# Patient Record
Sex: Female | Born: 1960
Health system: Southern US, Community
[De-identification: ages and names within clinical notes are randomized; demographics above are authoritative.]

## PROBLEM LIST (undated history)

## (undated) DIAGNOSIS — C801 Malignant (primary) neoplasm, unspecified: Secondary | ICD-10-CM

## (undated) DIAGNOSIS — Z720 Tobacco use: Secondary | ICD-10-CM

## (undated) DIAGNOSIS — R590 Localized enlarged lymph nodes: Secondary | ICD-10-CM

## (undated) DIAGNOSIS — R918 Other nonspecific abnormal finding of lung field: Principal | ICD-10-CM

## (undated) DIAGNOSIS — R0600 Dyspnea, unspecified: Secondary | ICD-10-CM

## (undated) HISTORY — DX: Other nonspecific abnormal finding of lung field: R91.8

## (undated) HISTORY — PX: NO PAST SURGERIES: SHX2092

## (undated) HISTORY — DX: Localized enlarged lymph nodes: R59.0

## (undated) HISTORY — DX: Tobacco use: Z72.0

---

## 2013-11-15 ENCOUNTER — Ambulatory Visit (INDEPENDENT_AMBULATORY_CARE_PROVIDER_SITE_OTHER): Payer: 59 | Admitting: Family Medicine

## 2013-11-15 ENCOUNTER — Ambulatory Visit (INDEPENDENT_AMBULATORY_CARE_PROVIDER_SITE_OTHER)
Admission: RE | Admit: 2013-11-15 | Discharge: 2013-11-15 | Disposition: A | Discharge status: Home / Self Care | Payer: 59 | Source: Ambulatory Visit | Attending: Family Medicine | Admitting: Family Medicine

## 2013-11-15 ENCOUNTER — Encounter: Payer: Self-pay | Admitting: Family Medicine

## 2013-11-15 VITALS — BP 136/82 | HR 84 | Temp 98.1°F | Ht 62.0 in | Wt 204.2 lb

## 2013-11-15 DIAGNOSIS — R918 Other nonspecific abnormal finding of lung field: Secondary | ICD-10-CM

## 2013-11-15 DIAGNOSIS — F17200 Nicotine dependence, unspecified, uncomplicated: Secondary | ICD-10-CM

## 2013-11-15 DIAGNOSIS — Z720 Tobacco use: Secondary | ICD-10-CM | POA: Insufficient documentation

## 2013-11-15 DIAGNOSIS — N951 Menopausal and female climacteric states: Secondary | ICD-10-CM | POA: Insufficient documentation

## 2013-11-15 NOTE — Assessment & Plan Note (Signed)
Continue to encourage cessation Declines pharmacotherapy. Discussed [email or web address] and quit coaches available. 30+ PY hx.

## 2013-11-15 NOTE — Progress Notes (Signed)
BP 136/82  Pulse 84  Temp(Src) 98.1 F (36.7 C) (Oral)  Ht 5\' 2"  (1.575 m)  Wt 204 lb 4 oz (92.647 kg)  BMI 37.35 kg/m2  LMP 09/03/2013   CC: new pt to establish  Subjective:    Patient ID: Rhonda Meyers, female    DOB: 1961/03/25, 53 y.o.   MRN: [id number]  HPI: Dynastee Brummell is a 53 y.o. female presenting on 11/15/2013 for Establish Care   Smoking - 1/2 ppd. Trying to quit. Motivated to quit. Declines pharmacotherapy. Denies dyspnea, cough. Some dyspnea on exertion with climbing 1-2 flights of stairs.  Trying to increase walking. Has lost several pounds in last few weeks Currently undergoing dental work (extractions)  LMP 09/03/2013 first one in past year - getting less frequent. + hot flashes  Preventative: Last CPE 7 yrs ago Well woman with OBGYN (Dr Kenton Kingfisher) 2 yrs ago. Flu yearly at work Tetanus - thinks has had done about 7 yrs ago Completing hepatitis series.  Lives alone, no pets Occupation: housekeeper at [employer] Activity: regular walking Diet: good water, fruits/vegetables daily  Relevant past medical, surgical, family and social history reviewed and updated as indicated.  Allergies and medications reviewed and updated. No current outpatient prescriptions on file prior to visit.   No current facility-administered medications on file prior to visit.    Review of Systems Per HPI unless specifically indicated above    Objective:    BP 136/82  Pulse 84  Temp(Src) 98.1 F (36.7 C) (Oral)  Ht 5\' 2"  (1.575 m)  Wt 204 lb 4 oz (92.647 kg)  BMI 37.35 kg/m2  LMP 09/03/2013  Physical Exam  Nursing note and vitals reviewed. Constitutional: She is oriented to person, place, and time. She appears well-developed and well-nourished. No distress.  HENT:  Head: Normocephalic and atraumatic.  Right Ear: Hearing normal.  Left Ear: Hearing normal.  Nose: Nose normal.  Mouth/Throat: Uvula is midline, oropharynx is clear and moist and mucous membranes  are normal. No oropharyngeal exudate, posterior oropharyngeal edema or posterior oropharyngeal erythema.  Eyes: Conjunctivae and EOM are normal. Pupils are equal, round, and reactive to light. No scleral icterus.  Neck: Normal range of motion. Neck supple.  Cardiovascular: Normal rate, regular rhythm, normal heart sounds and intact distal pulses.   No murmur heard. Pulses:      Radial pulses are 2+ on the right side, and 2+ on the left side.  Pulmonary/Chest: Effort normal. No respiratory distress. She has decreased breath sounds. She has no wheezes. She has no rhonchi. She has no rales.  Coarse sounding R lower lobes  Musculoskeletal: Normal range of motion. She exhibits no edema.  Lymphadenopathy:    She has no cervical adenopathy.  Neurological: She is alert and oriented to person, place, and time.  CN grossly intact, station and gait intact  Skin: Skin is warm and dry. No rash noted.  Psychiatric: She has a normal mood and affect. Her behavior is normal. Judgment and thought content normal.   No results found for this or any previous visit.    Assessment & Plan:   Problem List Items Addressed This Visit   Smoker - Primary     Continue to encourage cessation Declines pharmacotherapy. Discussed [email or web address] and quit coaches available. 30+ PY hx.    Perimenopause     Reviewed sxs and natural course of menopause.    Other nonspecific abnormal finding of lung field     In 30+PY smoker - check xray today for  baseline. Continue to encourage cessation.        Follow up plan: Return in about 3 months (around 02/15/2014), or as needed, for annual exam, prior fasting for blood work.

## 2013-11-15 NOTE — Patient Instructions (Signed)
Good to meet you today! Call us with questions. Return as needed or in 3-4 months for physical, prior fasting for blood work. Let's check xray today for baseline. Keep working towards quitting smoking.

## 2013-11-15 NOTE — Assessment & Plan Note (Signed)
In 30+PY smoker - check xray today for baseline. Continue to encourage cessation.

## 2013-11-15 NOTE — Assessment & Plan Note (Addendum)
Reviewed sxs and natural course of menopause.

## 2013-11-15 NOTE — Addendum Note (Signed)
Addended by: Ria Bush on: 11/15/2013 10:05 AM   Modules accepted: Orders

## 2013-11-15 NOTE — Progress Notes (Signed)
Pre visit review using our clinic review tool, if applicable. No additional management support is needed unless otherwise documented below in the visit note. 

## 2017-10-22 ENCOUNTER — Ambulatory Visit (INDEPENDENT_AMBULATORY_CARE_PROVIDER_SITE_OTHER): Payer: Self-pay | Admitting: Family Medicine

## 2017-10-22 ENCOUNTER — Encounter: Payer: Self-pay | Admitting: Family Medicine

## 2017-10-22 VITALS — BP 124/80 | HR 86 | Temp 98.4°F | Ht 62.0 in | Wt 190.0 lb

## 2017-10-22 DIAGNOSIS — Z0000 Encounter for general adult medical examination without abnormal findings: Secondary | ICD-10-CM

## 2017-10-22 LAB — POCT URINALYSIS DIPSTICK
Bilirubin, UA: NEGATIVE
Blood, UA: NEGATIVE
Glucose, UA: NEGATIVE
KETONES UA: NEGATIVE
Nitrite, UA: NEGATIVE
PH UA: 5.5 (ref 5.0–8.0)
PROTEIN UA: NEGATIVE
UROBILINOGEN UA: 0.2 U/dL

## 2017-10-22 NOTE — Patient Instructions (Addendum)
Establish and follow-up with a PCP to address overdue health maintenance and obtain routine screening labs.   Goal BMI  <25. Encouraged efforts to reduce weight include engaging in physical activity as tolerated with goal of 150 minutes per week. Improve dietary choices and eat a meal regimen consistent with a Mediterranean or DASH diet. Reduce simple carbohydrates. Do not skip meals and eat healthy snacks throughout the day to avoid over-eating at dinner. Set a goal weight loss that is achievable for you.    Preventive Care 40-64 Years, Female Preventive care refers to lifestyle choices and visits with your health care provider that can promote health and wellness. What does preventive care include?  A yearly physical exam. This is also called an annual well check.  Dental exams once or twice a year.  Routine eye exams. Ask your health care provider how often you should have your eyes checked.  Personal lifestyle choices, including: ? Daily care of your teeth and gums. ? Regular physical activity. ? Eating a healthy diet. ? Avoiding tobacco and drug use. ? Limiting alcohol use. ? Practicing safe sex. ? Taking low-dose aspirin daily starting at age 4. ? Taking vitamin and mineral supplements as recommended by your health care provider. What happens during an annual well check? The services and screenings done by your health care provider during your annual well check will depend on your age, overall health, lifestyle risk factors, and family history of disease. Counseling Your health care provider may ask you questions about your:  Alcohol use.  Tobacco use.  Drug use.  Emotional well-being.  Home and relationship well-being.  Sexual activity.  Eating habits.  Work and work Statistician.  Method of birth control.  Menstrual cycle.  Pregnancy history.  Screening You may have the following tests or measurements:  Height, weight, and BMI.  Blood  pressure.  Lipid and cholesterol levels. These may be checked every 5 years, or more frequently if you are over 33 years old.  Skin check.  Lung cancer screening. You may have this screening every year starting at age 19 if you have a 30-pack-year history of smoking and currently smoke or have quit within the past 15 years.  Fecal occult blood test (FOBT) of the stool. You may have this test every year starting at age 86.  Flexible sigmoidoscopy or colonoscopy. You may have a sigmoidoscopy every 5 years or a colonoscopy every 10 years starting at age 79.  Hepatitis C blood test.  Hepatitis B blood test.  Sexually transmitted disease (STD) testing.  Diabetes screening. This is done by checking your blood sugar (glucose) after you have not eaten for a while (fasting). You may have this done every 1-3 years.  Mammogram. This may be done every 1-2 years. Talk to your health care provider about when you should start having regular mammograms. This may depend on whether you have a family history of breast cancer.  BRCA-related cancer screening. This may be done if you have a family history of breast, ovarian, tubal, or peritoneal cancers.  Pelvic exam and Pap test. This may be done every 3 years starting at age 12. Starting at age 35, this may be done every 5 years if you have a Pap test in combination with an HPV test.  Bone density scan. This is done to screen for osteoporosis. You may have this scan if you are at high risk for osteoporosis.  Discuss your test results, treatment options, and if necessary, the need for  more tests with your health care provider. Vaccines Your health care provider may recommend certain vaccines, such as:  Influenza vaccine. This is recommended every year.  Tetanus, diphtheria, and acellular pertussis (Tdap, Td) vaccine. You may need a Td booster every 10 years.  Varicella vaccine. You may need this if you have not been vaccinated.  Zoster vaccine. You  may need this after age 28.  Measles, mumps, and rubella (MMR) vaccine. You may need at least one dose of MMR if you were born in 1957 or later. You may also need a second dose.  Pneumococcal 13-valent conjugate (PCV13) vaccine. You may need this if you have certain conditions and were not previously vaccinated.  Pneumococcal polysaccharide (PPSV23) vaccine. You may need one or two doses if you smoke cigarettes or if you have certain conditions.  Meningococcal vaccine. You may need this if you have certain conditions.  Hepatitis A vaccine. You may need this if you have certain conditions or if you travel or work in places where you may be exposed to hepatitis A.  Hepatitis B vaccine. You may need this if you have certain conditions or if you travel or work in places where you may be exposed to hepatitis B.  Haemophilus influenzae type b (Hib) vaccine. You may need this if you have certain conditions.  Talk to your health care provider about which screenings and vaccines you need and how often you need them. This information is not intended to replace advice given to you by your health care provider. Make sure you discuss any questions you have with your health care provider. Document Released: 05/19/2015 Document Revised: 01/10/2016 Document Reviewed: 02/21/2015 Elsevier Interactive Patient Education  2018 [place] Risks of Smoking Smoking cigarettes is very bad for your health. Tobacco smoke has over 200 known poisons in it. It contains the poisonous gases nitrogen oxide and carbon monoxide. There are over 60 chemicals in tobacco smoke that cause cancer. Smoking is difficult to quit because a chemical in tobacco, called nicotine, causes addiction or dependence. When you smoke and inhale, nicotine is absorbed rapidly into the bloodstream through your lungs. Both inhaled and non-inhaled nicotine may be addictive. What are the risks of cigarette smoke? Cigarette smokers  have an increased risk of many serious medical problems, including:  Lung cancer.  Lung disease, such as pneumonia, bronchitis, and emphysema.  Chest pain (angina) and heart attack because the heart is not getting enough oxygen.  Heart disease and peripheral blood vessel disease.  High blood pressure (hypertension).  Stroke.  Oral cancer, including cancer of the lip, mouth, or voice box.  Bladder cancer.  Pancreatic cancer.  Cervical cancer.  Pregnancy complications, including premature birth.  Stillbirths and smaller newborn babies, birth defects, and genetic damage to sperm.  Early menopause.  Lower estrogen level for women.  Infertility.  Facial wrinkles.  Blindness.  Increased risk of broken bones (fractures).  Senile dementia.  Stomach ulcers and internal bleeding.  Delayed wound healing and increased risk of complications during surgery.  Even smoking lightly shortens your life expectancy by several years.  Because of secondhand smoke exposure, children of smokers have an increased risk of the following:  Sudden infant death syndrome (SIDS).  Respiratory infections.  Lung cancer.  Heart disease.  Ear infections.  What are the benefits of quitting? There are many health benefits of quitting smoking. Here are some of them:  Within days of quitting smoking, your risk of having a heart  attack decreases, your blood flow improves, and your lung capacity improves. Blood pressure, pulse rate, and breathing patterns start returning to normal soon after quitting.  Within months, your lungs may clear up completely.  Quitting for 10 years reduces your risk of developing lung cancer and heart disease to almost that of a nonsmoker.  People who quit may see an improvement in their overall quality of life.  How do I quit smoking? Smoking is an addiction with both physical and psychological effects, and longtime habits can be hard to change. Your health care  provider can recommend:  Programs and community resources, which may include group support, education, or talk therapy.  Prescription medicines to help reduce cravings.  Nicotine replacement products, such as patches, gum, and nasal sprays. Use these products only as directed. Do not replace cigarette smoking with electronic cigarettes, which are commonly called e-cigarettes. The safety of e-cigarettes is not known, and some may contain harmful chemicals.  A combination of two or more of these methods.  Where to find more information:  American Lung Association: www.lung.org  American Cancer Society: www.cancer.org Summary  Smoking cigarettes is very bad for your health. Cigarette smokers have an increased risk of many serious medical problems, including several cancers, heart disease, and stroke.  Smoking is an addiction with both physical and psychological effects, and longtime habits can be hard to change.  By stopping right away, you can greatly reduce the risk of medical problems for you and your family.  To help you quit smoking, your health care provider can recommend programs, community resources, prescription medicines, and nicotine replacement products such as patches, gum, and nasal sprays. This information is not intended to replace advice given to you by your health care provider. Make sure you discuss any questions you have with your health care provider. Document Released: 05/30/2004 Document Revised: 04/26/2016 Document Reviewed: 04/26/2016 Elsevier Interactive Patient Education  2017 [employer].

## 2017-10-22 NOTE — Progress Notes (Signed)
Subjective:  Rhonda Meyers is a 57 y.o. female who presents for routine wellness exam to satisfy employer sponsored health insurance requirements. Patient reports that she has a PCP at [facility] for which she hasn't seen in several years.  She is an occasional smoker with a greater than 30 pack year history. She reports no routine physical exercise, however, occasionally walks. Body mass index is 34.75 kg/m. She denies chest pain, headache, dizziness,  shortness of breath at rest, reports some exertional shortness of breath with vigorous activity. Denies any known family history significant for heart disease, diabetes, or cancer. She is postmenopausal with her last period occurring greater than 2 years ago.Patient denies any current health related concerns.  There is no immunization history on file for this patient.  No past medical history on file.  Past Surgical History:  Procedure Laterality Date  . NO PAST SURGERIES      Social History   Tobacco Use  . Smoking status: Current Every Day Smoker    Packs/day: 0.50    Types: Cigarettes    Start date: 05/06/1981  . Smokeless tobacco: Never Used  Substance Use Topics  . Alcohol use: Yes    Comment: 1 bottle wine on weekends  . Drug use: No    No Known Allergies  No current outpatient medications on file.   No current facility-administered medications for this visit.     ROS   Objective:  Blood pressure 124/80, pulse 86, temperature 98.4 F (36.9 C), height 5\' 2"  (1.575 m), weight 190 lb (86.2 kg), SpO2 98 %.  General Appearance:  Alert, cooperative, no distress, appears stated age  Head:  Normocephalic, without obvious abnormality, atraumatic  Eyes:  PERRL, conjunctiva/corneas clear, EOM's intact  Ears:  Normal TM's and external ear canals, both ears  Nose: Nares normal, septum midline,mucosa normal, no drainage or sinus tenderness  Throat: Lips, mucosa, and tongue normal; teeth and gums normal  Neck:  Supple, symmetrical, trachea midline, no adenopathy;  thyroid: not enlarged, symmetric, no tenderness/mass/nodules; no carotid bruit or JVD  Back:   Symmetric, no curvature, ROM normal, no CVA tenderness  Lungs:   Clear to auscultation bilaterally, respirations unlabored  Breasts:  No masses or tenderness  Heart:  Regular rate and rhythm, S1 and S2 normal, no murmur, rub, or gallop  Abdomen:   Soft, non-tender, bowel sounds active all four quadrants,  no masses, no organomegaly. Enlarged abdominal girth although soft and non-distended.  Extremities: Extremities normal, atraumatic, no cyanosis or edema  Pulses: 2+ and symmetric  Skin: Skin color, texture, turgor normal, no rashes or lesions  Lymph nodes: Cervical nodes normal  Neurologic: Normal   Assessment:  Wellness Exam   Plan:  Patient education provided.  Recommended immediate follow-up with PCP in order to address overdue health maintenance and obtain screening labs. She is at increased risk for a cardiovascular event given her smoking history and obesity. She is negative of known family history of heart disease. UA significant today for +1 leukocytes.  No urine symptoms. Patient not well hydrated. No treatment indicated. Patient will follow up with PCP.  Carroll Sage. Kenton Kingfisher, MSN, FNP-C [facility]  [address], [place] [address] [phone]

## 2018-01-07 ENCOUNTER — Ambulatory Visit (HOSPITAL_COMMUNITY)
Admission: EM | Admit: 2018-01-07 | Discharge: 2018-01-07 | Disposition: A | Discharge status: Home / Self Care | Payer: 59 | Attending: Family Medicine | Admitting: Family Medicine

## 2018-01-07 ENCOUNTER — Ambulatory Visit (INDEPENDENT_AMBULATORY_CARE_PROVIDER_SITE_OTHER): Payer: 59

## 2018-01-07 ENCOUNTER — Encounter (HOSPITAL_COMMUNITY): Payer: Self-pay

## 2018-01-07 DIAGNOSIS — R0981 Nasal congestion: Secondary | ICD-10-CM | POA: Diagnosis not present

## 2018-01-07 DIAGNOSIS — J40 Bronchitis, not specified as acute or chronic: Secondary | ICD-10-CM | POA: Diagnosis not present

## 2018-01-07 DIAGNOSIS — R5383 Other fatigue: Secondary | ICD-10-CM | POA: Diagnosis not present

## 2018-01-07 DIAGNOSIS — R05 Cough: Secondary | ICD-10-CM | POA: Diagnosis not present

## 2018-01-07 MED ORDER — IPRATROPIUM-ALBUTEROL 0.5-2.5 (3) MG/3ML IN SOLN
3.0000 mL | Freq: Once | RESPIRATORY_TRACT | Status: AC
  Administered 2018-01-07: 3 mL via RESPIRATORY_TRACT

## 2018-01-07 MED ORDER — IPRATROPIUM-ALBUTEROL 0.5-2.5 (3) MG/3ML IN SOLN
RESPIRATORY_TRACT | Status: AC
  Filled 2018-01-07: qty 3

## 2018-01-07 MED ORDER — PREDNISONE 10 MG (21) PO TBPK: ORAL_TABLET | ORAL | 0 refills | Status: DC

## 2018-01-07 MED ORDER — ALBUTEROL SULFATE HFA 108 (90 BASE) MCG/ACT IN AERS: 1.0000 | INHALATION_SPRAY | Freq: Four times a day (QID) | RESPIRATORY_TRACT | 0 refills | Status: DC | PRN

## 2018-01-07 NOTE — ED Notes (Signed)
Patient transported to X-ray 

## 2018-01-07 NOTE — ED Provider Notes (Signed)
[facility]    CSN: [id number] Arrival date & time: 01/07/18  1431     History   Chief Complaint Chief Complaint  Patient presents with  . Cough  . Fatigue  . Congestion    HPI Rhonda Meyers is a 57 y.o. female.   Patient is a 57 year old female with no significant past medical history.  She presents with 2 weeks of cough, congestion, fatigue.  The symptoms have been constant.  She has felt slightly better since being able to get some congestion up.  She has been drinking a lot of water and taken some over-the-counter cough and congestion medication.  She does smoke but denies any history of asthma, COPD.  Denies any associated fevers, chills, body aches, fatigue, activity change, loss of appetite.  Denies any chest pain, shortness of breath.   Family history of cancer, diabetes, stroke, retention. She is a current everyday smoker.  Does not drink.  No drug use ROS per HPI         History reviewed. No pertinent past medical history.  Patient Active Problem List   Diagnosis Date Noted  . Smoker 11/15/2013  . Other nonspecific abnormal finding of lung field 11/15/2013  . Perimenopause 11/15/2013    Past Surgical History:  Procedure Laterality Date  . NO PAST SURGERIES      OB History   None      Home Medications    Prior to Admission medications   Medication Sig Start Date End Date Taking? Authorizing Provider  albuterol (PROVENTIL HFA;VENTOLIN HFA) 108 (90 Base) MCG/ACT inhaler Inhale 1-2 puffs into the lungs every 6 (six) hours as needed for wheezing or shortness of breath. 01/07/18   Loura Halt A, NP  predniSONE (STERAPRED UNI-PAK 21 TAB) 10 MG (21) TBPK tablet 6 tabs for 1 day, then 5 tabs for 1 das, then 4 tabs for 1 day, then 3 tabs for 1 day, 2 tabs for 1 day, then 1 tab for 1 day 01/07/18   Orvan July, NP    Family History Family History  Problem Relation Age of Onset  . Cancer Maternal Grandmother 58       breast  . Rheumatic  fever Mother   . CAD Mother 52       MI  . Hypertension Sister   . Diabetes Neg Hx   . Stroke Neg Hx     Social History Social History   Tobacco Use  . Smoking status: Current Every Day Smoker    Packs/day: 0.50    Types: Cigarettes    Start date: 05/06/1981  . Smokeless tobacco: Never Used  Substance Use Topics  . Alcohol use: Yes    Comment: 1 bottle wine on weekends  . Drug use: No     Allergies   Patient has no known allergies.   Review of Systems Review of Systems   Physical Exam Triage Vital Signs ED Triage Vitals  Enc Vitals Group     BP 01/07/18 1514 122/85     Pulse Rate 01/07/18 1514 77     Resp 01/07/18 1514 16     Temp 01/07/18 1514 97.9 F (36.6 C)     Temp Source 01/07/18 1514 Oral     SpO2 01/07/18 1514 95 %     Weight --      Height --      Head Circumference --      Peak Flow --      Pain Score 01/07/18  1521 0     Pain Loc --      Pain Edu? --      Excl. in [place]? --    No data found.  Updated Vital Signs BP 122/85 (BP Location: Left Arm)   Pulse 77   Temp 97.9 F (36.6 C) (Oral)   Resp 16   LMP 09/03/2013   SpO2 95%   Visual Acuity Right Eye Distance:   Left Eye Distance:   Bilateral Distance:    Right Eye Near:   Left Eye Near:    Bilateral Near:     Physical Exam  Constitutional: She is oriented to person, place, and time. She appears well-developed and well-nourished. No distress.  Very pleasant. Non toxic or ill appearing.     HENT:  Head: Normocephalic and atraumatic.  Right Ear: External ear normal.  Left Ear: External ear normal.  Mouth/Throat: Oropharynx is clear and moist.  Eyes: Conjunctivae are normal.  Neck: Normal range of motion.  Cardiovascular: Normal rate and regular rhythm.  Pulmonary/Chest: Effort normal.  Course lung sounds in all fields.  No dyspnea or respiratory distress.  Musculoskeletal: Normal range of motion.  Neurological: She is alert and oriented to person, place, and time.  Skin: Skin  is warm and dry.  Psychiatric: She has a normal mood and affect.  Nursing note and vitals reviewed.    UC Treatments / Results  Labs (all labs ordered are listed, but only abnormal results are displayed) Labs Reviewed - No data to display  EKG None  Radiology Dg Chest 2 View  Result Date: 01/07/2018 CLINICAL DATA:  Cough for 2 weeks. EXAM: CHEST - 2 VIEW COMPARISON:  11/15/2013 FINDINGS: There is no focal parenchymal opacity. There is no pleural effusion or pneumothorax. There is stable cardiomegaly. The osseous structures are unremarkable. IMPRESSION: No active cardiopulmonary disease. Electronically Signed   By: Kathreen Devoid   On: 01/07/2018 16:11    Procedures Procedures (including critical care time)  Medications Ordered in UC Medications  ipratropium-albuterol (DUONEB) 0.5-2.5 (3) MG/3ML nebulizer solution 3 mL (3 mLs Nebulization Given 01/07/18 1609)    Initial Impression / Assessment and Plan / UC Course  I have reviewed the triage vital signs and the nursing notes.  Pertinent labs & imaging results that were available during my care of the patient were reviewed by me and considered in my medical decision making (see chart for details).     X-ray negative for infection. Most likely bronchitis Will treat with albuterol inhaler as needed and burst of steroids. Continue staying hydrated and using over-the-counter cough and congestion medication as needed for symptoms. Follow up as needed for continued or worsening symptoms  Final Clinical Impressions(s) / UC Diagnoses   Final diagnoses:  Bronchitis     Discharge Instructions     It was nice meeting you!!  Your x-ray was negative for infection. Most likely symptoms are related to a viral upper respiratory infection with some bronchitis. I will give you an albuterol inhaler to use at home as needed for cough, wheezing, shortness of breath.  I am also going to give you a round of prednisone to help with  inflammation in your lungs. Follow-up as needed for continued or worsening symptoms      ED Prescriptions    Medication Sig Dispense Auth. Provider   albuterol (PROVENTIL HFA;VENTOLIN HFA) 108 (90 Base) MCG/ACT inhaler Inhale 1-2 puffs into the lungs every 6 (six) hours as needed for wheezing or shortness of breath.  1 Inhaler Jaron Czarnecki A, NP   predniSONE (STERAPRED UNI-PAK 21 TAB) 10 MG (21) TBPK tablet 6 tabs for 1 day, then 5 tabs for 1 das, then 4 tabs for 1 day, then 3 tabs for 1 day, 2 tabs for 1 day, then 1 tab for 1 day 21 tablet Etha Stambaugh A, NP     Controlled Substance Prescriptions [place] Controlled Substance Registry consulted? Not Applicable   Orvan July, NP 01/07/18 1714

## 2018-01-07 NOTE — Discharge Instructions (Addendum)
It was nice meeting you!!  Your x-ray was negative for infection. Most likely symptoms are related to a viral upper respiratory infection with some bronchitis. I will give you an albuterol inhaler to use at home as needed for cough, wheezing, shortness of breath.  I am also going to give you a round of prednisone to help with inflammation in your lungs. Follow-up as needed for continued or worsening symptoms

## 2018-01-07 NOTE — ED Triage Notes (Signed)
Pt presents with ongoing bronchial cough, fatigue, and chest congestion.

## 2018-01-17 ENCOUNTER — Emergency Department (HOSPITAL_COMMUNITY)
Admission: EM | Admit: 2018-01-17 | Discharge: 2018-01-17 | Disposition: A | Discharge status: Home / Self Care | Payer: 59 | Attending: Emergency Medicine | Admitting: Emergency Medicine

## 2018-01-17 ENCOUNTER — Encounter (HOSPITAL_COMMUNITY): Payer: Self-pay

## 2018-01-17 ENCOUNTER — Emergency Department (HOSPITAL_COMMUNITY): Payer: 59

## 2018-01-17 ENCOUNTER — Ambulatory Visit (INDEPENDENT_AMBULATORY_CARE_PROVIDER_SITE_OTHER)
Admission: EM | Admit: 2018-01-17 | Discharge: 2018-01-17 | Disposition: A | Discharge status: Home / Self Care | Payer: 59 | Source: Home / Self Care

## 2018-01-17 ENCOUNTER — Encounter (HOSPITAL_COMMUNITY): Payer: Self-pay | Admitting: *Deleted

## 2018-01-17 ENCOUNTER — Ambulatory Visit (INDEPENDENT_AMBULATORY_CARE_PROVIDER_SITE_OTHER): Payer: 59

## 2018-01-17 ENCOUNTER — Other Ambulatory Visit: Payer: Self-pay

## 2018-01-17 DIAGNOSIS — R05 Cough: Secondary | ICD-10-CM

## 2018-01-17 DIAGNOSIS — R0602 Shortness of breath: Secondary | ICD-10-CM | POA: Diagnosis not present

## 2018-01-17 DIAGNOSIS — R000 Tachycardia, unspecified: Secondary | ICD-10-CM | POA: Diagnosis not present

## 2018-01-17 DIAGNOSIS — R0789 Other chest pain: Secondary | ICD-10-CM | POA: Diagnosis not present

## 2018-01-17 DIAGNOSIS — R059 Cough, unspecified: Secondary | ICD-10-CM

## 2018-01-17 DIAGNOSIS — R0689 Other abnormalities of breathing: Secondary | ICD-10-CM

## 2018-01-17 DIAGNOSIS — R079 Chest pain, unspecified: Secondary | ICD-10-CM | POA: Diagnosis not present

## 2018-01-17 DIAGNOSIS — F17210 Nicotine dependence, cigarettes, uncomplicated: Secondary | ICD-10-CM | POA: Diagnosis not present

## 2018-01-17 LAB — I-STAT TROPONIN, ED: TROPONIN I, POC: 0 ng/mL (ref 0.00–0.08)

## 2018-01-17 LAB — POCT I-STAT, CHEM 8
BUN: 11 mg/dL (ref 6–20)
CALCIUM ION: 1.14 mmol/L — AB (ref 1.15–1.40)
Chloride: 101 mmol/L (ref 98–111)
Creatinine, Ser: 0.9 mg/dL (ref 0.44–1.00)
Glucose, Bld: 98 mg/dL (ref 70–99)
HCT: 49 % — ABNORMAL HIGH (ref 36.0–46.0)
HEMOGLOBIN: 16.7 g/dL — AB (ref 12.0–15.0)
Potassium: 3.9 mmol/L (ref 3.5–5.1)
SODIUM: 136 mmol/L (ref 135–145)
TCO2: 26 mmol/L (ref 22–32)

## 2018-01-17 LAB — CBC
HCT: 49.4 % — ABNORMAL HIGH (ref 36.0–46.0)
Hemoglobin: 15.1 g/dL — ABNORMAL HIGH (ref 12.0–15.0)
MCH: 27.2 pg (ref 26.0–34.0)
MCHC: 30.6 g/dL (ref 30.0–36.0)
MCV: 88.8 fL (ref 78.0–100.0)
PLATELETS: 315 10*3/uL (ref 150–400)
RBC: 5.56 MIL/uL — AB (ref 3.87–5.11)
RDW: 14.1 % (ref 11.5–15.5)
WBC: 16.6 10*3/uL — ABNORMAL HIGH (ref 4.0–10.5)

## 2018-01-17 LAB — BASIC METABOLIC PANEL
Anion gap: 12 (ref 5–15)
BUN: 10 mg/dL (ref 6–20)
CALCIUM: 9.1 mg/dL (ref 8.9–10.3)
CO2: 25 mmol/L (ref 22–32)
CREATININE: 0.88 mg/dL (ref 0.44–1.00)
Chloride: 100 mmol/L (ref 98–111)
GFR calc non Af Amer: >60 mL/min (ref >60)
Glucose, Bld: 94 mg/dL (ref 70–99)
Potassium: 4 mmol/L (ref 3.5–5.1)
Sodium: 137 mmol/L (ref 135–145)

## 2018-01-17 MED ORDER — IOPAMIDOL (ISOVUE-370) INJECTION 76%
INTRAVENOUS | Status: AC
  Administered 2018-01-17: 60 mL
  Filled 2018-01-17: qty 100

## 2018-01-17 MED ORDER — PREDNISONE 20 MG PO TABS: ORAL_TABLET | ORAL | 0 refills | Status: DC

## 2018-01-17 MED ORDER — PREDNISONE 20 MG PO TABS
60.0000 mg | ORAL_TABLET | Freq: Once | ORAL | Status: AC
  Administered 2018-01-17: 60 mg via ORAL
  Filled 2018-01-17: qty 3

## 2018-01-17 NOTE — ED Triage Notes (Addendum)
Pt states she still had "bronchitis" symptoms- non-productive cough, chest pain with cough, and feeling like she can't breathe. Pt states she was seen here about two weeks ago and was prescribed Prednisone.

## 2018-01-17 NOTE — ED Notes (Signed)
Patient transported to CT 

## 2018-01-17 NOTE — ED Triage Notes (Signed)
Pt here for SOB since last week, seen at urgent care last week and pt diagnosed with bronchitis. Pt still SOB and went back to UC today and had new xray, diagnosed with PNA. Left side diminished. AO x 4

## 2018-01-17 NOTE — ED Provider Notes (Signed)
[facility] [facility] EMERGENCY DEPARTMENT Provider Note   CSN: [id number] Arrival date & time: 01/17/18  1513     History   Chief Complaint Chief Complaint  Patient presents with  . Shortness of Breath    HPI Rhonda Meyers is a 57 y.o. female.  57 yo F with a cc of sob and cough, and going on for the past month. Hx of smoking for past 20 years.  Sob worsening over past 4-5 days.  Had some pinkish sputum that has resolved.  Has had a 35lb weight loss in 4 mos. Tried steroids and breathing treatments without improvement.  Seen at urgent care and sent here due to concerning xray results  Urgent care note was reviewed and they were concerned for multifocal pneumonia that may need IV antibiotics and admission.  Sent here also for CT scan for further delineation of the patient's chest x-ray findings.  The history is provided by the patient.  Illness  This is a new problem. The current episode started yesterday. The problem occurs constantly. Associated symptoms include shortness of breath. Pertinent negatives include no chest pain, no abdominal pain and no headaches. Nothing aggravates the symptoms. Nothing relieves the symptoms. She has tried nothing for the symptoms. The treatment provided no relief.    History reviewed. No pertinent past medical history.  Patient Active Problem List   Diagnosis Date Noted  . Smoker 11/15/2013  . Other nonspecific abnormal finding of lung field 11/15/2013  . Perimenopause 11/15/2013    Past Surgical History:  Procedure Laterality Date  . NO PAST SURGERIES       OB History   None      Home Medications    Prior to Admission medications   Medication Sig Start Date End Date Taking? Authorizing Provider  predniSONE (DELTASONE) 20 MG tablet 2 tabs po daily x 4 days 01/17/18   Deno Etienne, DO    Family History Family History  Problem Relation Age of Onset  . Cancer Maternal Grandmother 54       breast  . Rheumatic fever  Mother   . CAD Mother 54       MI  . Hypertension Sister   . Diabetes Neg Hx   . Stroke Neg Hx     Social History Social History   Tobacco Use  . Smoking status: Current Every Day Smoker    Packs/day: 0.50    Types: Cigarettes    Start date: 05/06/1981  . Smokeless tobacco: Never Used  Substance Use Topics  . Alcohol use: Yes    Comment: 1 bottle wine on weekends  . Drug use: No     Allergies   Patient has no known allergies.   Review of Systems Review of Systems  Constitutional: Negative for chills and fever.  HENT: Negative for congestion and rhinorrhea.   Eyes: Negative for redness and visual disturbance.  Respiratory: Positive for cough and shortness of breath. Negative for wheezing.   Cardiovascular: Negative for chest pain and palpitations.  Gastrointestinal: Negative for abdominal pain, nausea and vomiting.  Genitourinary: Negative for dysuria and urgency.  Musculoskeletal: Negative for arthralgias and myalgias.  Skin: Negative for pallor and wound.  Neurological: Negative for dizziness and headaches.     Physical Exam Updated Vital Signs BP 128/83 (BP Location: Right Arm)   Pulse 99   Temp 98.7 F (37.1 C) (Oral)   Resp (!) 22   Wt 79.8 kg   LMP 09/03/2013   SpO2 98%  BMI 32.19 kg/m   Physical Exam  Constitutional: She is oriented to person, place, and time. She appears well-developed and well-nourished. No distress.  HENT:  Head: Normocephalic and atraumatic.  Eyes: Pupils are equal, round, and reactive to light. EOM are normal.  Neck: Normal range of motion. Neck supple.  Cardiovascular: Normal rate and regular rhythm. Exam reveals no gallop and no friction rub.  No murmur heard. Pulmonary/Chest: Effort normal. She has decreased breath sounds in the left middle field and the left lower field. She has no wheezes. She has no rhonchi. She has no rales.  Abdominal: Soft. She exhibits no distension. There is no tenderness.  Musculoskeletal: She  exhibits no edema or tenderness.  Neurological: She is alert and oriented to person, place, and time.  Skin: Skin is warm and dry. She is not diaphoretic.  Psychiatric: She has a normal mood and affect. Her behavior is normal.  Nursing note and vitals reviewed.    ED Treatments / Results  Labs (all labs ordered are listed, but only abnormal results are displayed) Labs Reviewed  CBC - Abnormal; Notable for the following components:      Result Value   WBC 16.6 (*)    RBC 5.56 (*)    Hemoglobin 15.1 (*)    HCT 49.4 (*)    All other components within normal limits  BASIC METABOLIC PANEL  I-STAT TROPONIN, ED    EKG EKG Interpretation  Date/Time:  Saturday January 17 2018 15:44:31 EDT Ventricular Rate:  98 PR Interval:  128 QRS Duration: 86 QT Interval:  346 QTC Calculation: 441 R Axis:   22 Text Interpretation:  Normal sinus rhythm Cannot rule out Anterior infarct , age undetermined Abnormal ECG No old tracing to compare Confirmed by Deno Etienne [phone]) on 01/17/2018 4:40:38 PM   Radiology Dg Chest 2 View  Result Date: 01/17/2018 CLINICAL DATA:  Cough and chest pain and shortness of breath for 1 month. EXAM: CHEST - 2 VIEW COMPARISON:  Chest x-ray 01/07/2018 FINDINGS: Significant worsening of left lung airspace process when compared the prior study. There is a central appearing "mass ", significant loss of volume in the left hemithorax and diffuse airspace process. I would be very concerned about a central obstructing lesion with obstructive pneumonitis. Recommend chest CT with contrast for further evaluation. The right lung appears clear. IMPRESSION: Significant worsening of left lung airspace process with findings worrisome for a central mass and obstructive pneumonitis. Recommend CT with contrast for further evaluation. Electronically Signed   By: Marijo Sanes M.D.   On: 01/17/2018 15:09   Ct Angio Chest Pe W And/or Wo Contrast  Result Date: 01/17/2018 CLINICAL DATA:   Left-sided chest pain and coughing since yesterday. Shortness of breath 1 month. EXAM: CT ANGIOGRAPHY CHEST WITH CONTRAST TECHNIQUE: Multidetector CT imaging of the chest was performed using the standard protocol during bolus administration of intravenous contrast. Multiplanar CT image reconstructions and MIPs were obtained to evaluate the vascular anatomy. CONTRAST:  17mL ISOVUE-370 IOPAMIDOL (ISOVUE-370) INJECTION 76% COMPARISON:  Chest x-ray today. FINDINGS: Cardiovascular: Mild cardiomegaly. Thoracic aorta is otherwise unremarkable. Pulmonary arterial system is within normal without evidence of emboli. Mild compression of the proximal left lower lobar pulmonary artery due to the perihilar mass and mediastinal adenopathy. Mediastinum/Nodes: 1.3 cm prevascular lymph node. Bulky adenopathy over the region of the AP window extending into the left hilum and subcarinal region. 1.2 cm right paratracheal lymph node. Lungs/Pleura: There is volume loss of the left lung with obstruction of  the left mainstem bronchus. No air-filled central left bronchi are demonstrated. There is a large ill-defined left perihilar/central lung mass continuous with the bulky adenopathy over the left hilum, subcarinal region and AP window. Elevated left hemidiaphragm. Moderate collapse of the left lower lobe with patchy airspace opacification over the lingula. 1.3 cm nodule over the left upper lobe likely metastatic. Small amount left pleural fluid. Right lung is clear. Upper Abdomen: Liver is within normal. Mild prominence of the right adrenal gland. Mild calcification along the medial border of the right lobe of the liver. Musculoskeletal: Unremarkable. Review of the MIP images confirms the above findings. IMPRESSION: Large ill-defined central left perihilar mass continuous with bulky left hilar and mediastinal adenopathy as described likely representing a central primary bronchogenic neoplasm. There is complete obstruction of the left  mainstem bronchus with volume loss of the left lung. Small amount of left pleural fluid. Discrete 1.3 cm nodule over the left upper lobe likely metastatic. Recommend tissue sampling for further evaluation. No evidence of pulmonary emboli. Mild cardiomegaly. Electronically Signed   By: Marin Olp M.D.   On: 01/17/2018 19:00    Procedures Procedures (including critical care time)  Medications Ordered in ED Medications  predniSONE (DELTASONE) tablet 60 mg (has no administration in time range)  iopamidol (ISOVUE-370) 76 % injection (60 mLs  Contrast Given 01/17/18 1800)     Initial Impression / Assessment and Plan / ED Course  I have reviewed the triage vital signs and the nursing notes.  Pertinent labs & imaging results that were available during my care of the patient were reviewed by me and considered in my medical decision making (see chart for details).     57 yo F with cc of sob.  Going on for the past month, worsening over past couple days. Had an xray today concerning for new mass vs multifocal pna.  Ambulated without hypoxia.  CT scan with likely primary bronchogenic carcinoma, no infectious process.  Discussed with Dr. Julien Nordmann, will follow up with patient as an outpatient to schedule PET scan, biopsy.    Patient had some mild symptomatic improvement earlier this month with burst steroids. With hx of smoking will retry.  Oncology follow up.  7:38 PM:  I have discussed the diagnosis/risks/treatment options with the patient and family and believe the pt to be eligible for discharge home to follow-up with Oncology. We also discussed returning to the ED immediately if new or worsening sx occur. We discussed the sx which are most concerning (e.g., sudden worsening pain, fever, inability to tolerate by mouth) that necessitate immediate return. Medications administered to the patient during their visit and any new prescriptions provided to the patient are listed below.  Medications given  during this visit Medications  predniSONE (DELTASONE) tablet 60 mg (has no administration in time range)  iopamidol (ISOVUE-370) 76 % injection (60 mLs  Contrast Given 01/17/18 1800)      The patient appears reasonably screen and/or stabilized for discharge and I doubt any other medical condition or other [facility] requiring further screening, evaluation, or treatment in the ED at this time prior to discharge.      Final Clinical Impressions(s) / ED Diagnoses   Final diagnoses:  Shortness of breath    ED Discharge Orders         Ordered    predniSONE (DELTASONE) 20 MG tablet     01/17/18 1931           Deno Etienne, DO 01/17/18 1939

## 2018-01-17 NOTE — ED Notes (Signed)
Pt ambulated in hallway on pulse ox. Pts O2 saturation stayed between 100-92% during ambulation. Pt did complain of having shortness of breath while ambulating.

## 2018-01-17 NOTE — ED Provider Notes (Signed)
01/17/2018 2:52 PM   DOB: 1960/08/29 / MRN: [id number]  SUBJECTIVE:  Rhonda Meyers is a 57 y.o. female presenting for cough  that started weeks ago.  Assoicates cough productive of pink sputum.  Denies SOB, DOE, leg swelling. Marland Kitchen  Has tried prednisone from her last visit.    She has No Known Allergies.   She  has no past medical history on file.    She  reports that she has been smoking cigarettes. She started smoking about 36 years ago. She has been smoking about 0.50 packs per day. She has never used smokeless tobacco. She reports that she drinks alcohol. She reports that she does not use drugs. She  has no sexual activity history on file. The patient  has a past surgical history that includes No past surgeries.  Her family history includes CAD (age of onset: 17) in her mother; Cancer (age of onset: 82) in her maternal grandmother; Hypertension in her sister; Rheumatic fever in her mother.  Review of Systems  Constitutional: Negative for chills, diaphoresis and fever.  Respiratory: Positive for cough.   Cardiovascular: Negative for leg swelling.  Gastrointestinal: Negative for nausea.  Skin: Negative for rash.  Neurological: Negative for dizziness.    OBJECTIVE:  BP 120/86 (BP Location: Left Arm)   Pulse (!) 101   Temp 97.9 F (36.6 C) (Oral)   Resp 20   LMP 09/03/2013   SpO2 93%   Wt Readings from Last 3 Encounters:  10/22/17 190 lb (86.2 kg)  11/15/13 204 lb 4 oz (92.6 kg)   Temp Readings from Last 3 Encounters:  01/17/18 97.9 F (36.6 C) (Oral)  01/07/18 97.9 F (36.6 C) (Oral)  10/22/17 98.4 F (36.9 C)   BP Readings from Last 3 Encounters:  01/17/18 120/86  01/07/18 122/85  10/22/17 124/80   Pulse Readings from Last 3 Encounters:  01/17/18 (!) 101  01/07/18 77  10/22/17 86    Physical Exam  Constitutional: She is oriented to person, place, and time. She appears well-nourished. No distress.  Eyes: Pupils are equal, round, and reactive to light. EOM are  normal.  Cardiovascular: Normal rate, regular rhythm, S1 normal, S2 normal, normal heart sounds and intact distal pulses. Exam reveals no gallop, no friction rub and no decreased pulses.  No murmur heard. Pulmonary/Chest: Effort normal. She has decreased breath sounds in the left upper field, the left middle field and the left lower field. She has no wheezes. She has no rhonchi. She has no rales.  Abdominal: She exhibits no distension.  Musculoskeletal: She exhibits no edema.  Neurological: She is alert and oriented to person, place, and time. No cranial nerve deficit. Gait normal.  Skin: Skin is dry. She is not diaphoretic.  Psychiatric: She has a normal mood and affect.  Vitals reviewed.   Results for orders placed or performed during the hospital encounter of 01/17/18 (from the past 72 hour(s))  I-STAT, chem 8     Status: Abnormal   Collection Time: 01/17/18  2:34 PM  Result Value Ref Range   Sodium 136 135 - 145 mmol/L   Potassium 3.9 3.5 - 5.1 mmol/L   Chloride 101 98 - 111 mmol/L   BUN 11 6 - 20 mg/dL   Creatinine, Ser 0.90 0.44 - 1.00 mg/dL   Glucose, Bld 98 70 - 99 mg/dL   Calcium, Ion 1.14 (L) 1.15 - 1.40 mmol/L   TCO2 26 22 - 32 mmol/L   Hemoglobin 16.7 (H) 12.0 - 15.0 g/dL  HCT 49.0 (H) 36.0 - 46.0 %    No results found.  ASSESSMENT AND PLAN:   Cough: She has what appears to be an extensive pneumonia.  I am surprised she is not sicker appearing.  I am concerned that she may need IV antibiotics, a CT scan and possibly admission for this. I have asked that they go to the ED for further evaluation and management   Abnormal breath sounds  Tachycardia  Discharge Instructions   None        The patient is advised to call or return to clinic if she does not see an improvement in symptoms, or to seek the care of the closest emergency department if she worsens with the above plan.   Philis Fendt, MHS, PA-C 01/17/2018 2:52 PM   Tereasa Coop, PA-C 01/17/18  1454

## 2018-01-17 NOTE — Discharge Instructions (Signed)
Follow up with the oncologist.  He needs to get a PET scan of you and schedule a biopsy.  You can call his office on Monday and discuss that you were seen here and are going to be following up with him.

## 2018-01-17 NOTE — ED Notes (Signed)
Pt departed in NAD.  

## 2018-01-19 ENCOUNTER — Other Ambulatory Visit: Payer: 59

## 2018-01-19 ENCOUNTER — Telehealth: Payer: Self-pay | Admitting: *Deleted

## 2018-01-19 DIAGNOSIS — R918 Other nonspecific abnormal finding of lung field: Secondary | ICD-10-CM

## 2018-01-19 NOTE — Telephone Encounter (Signed)
Oncology Nurse Navigator Documentation  Oncology Nurse Navigator Flowsheets 01/19/2018  Navigator Location [facility]  Referral date to RadOnc/MedOnc 01/19/2018  Navigator Encounter Type Telephone/I received referral from ED.  I updated Dr. Julien Nordmann. He would like to see patient this Friday. I called patient and updated her on appt 01/23/18.  She verbalized understanding of appt time and place.   Telephone Outgoing Call  Treatment Phase Abnormal Scans  Barriers/Navigation Needs Education;Coordination of Care  Education Other  Interventions Coordination of Care;Education  Coordination of Care Appts  Education Method Verbal  Acuity Level 2  Time Spent with Patient 30

## 2018-01-23 ENCOUNTER — Telehealth: Payer: Self-pay | Admitting: Internal Medicine

## 2018-01-23 ENCOUNTER — Telehealth: Payer: Self-pay

## 2018-01-23 ENCOUNTER — Other Ambulatory Visit: Payer: Self-pay

## 2018-01-23 ENCOUNTER — Encounter: Payer: Self-pay | Admitting: Radiation Oncology

## 2018-01-23 ENCOUNTER — Inpatient Hospital Stay: Payer: 59 | Attending: Internal Medicine | Admitting: Internal Medicine

## 2018-01-23 DIAGNOSIS — J90 Pleural effusion, not elsewhere classified: Secondary | ICD-10-CM | POA: Insufficient documentation

## 2018-01-23 DIAGNOSIS — R918 Other nonspecific abnormal finding of lung field: Secondary | ICD-10-CM

## 2018-01-23 DIAGNOSIS — Z803 Family history of malignant neoplasm of breast: Secondary | ICD-10-CM | POA: Diagnosis not present

## 2018-01-23 DIAGNOSIS — R05 Cough: Secondary | ICD-10-CM | POA: Diagnosis not present

## 2018-01-23 DIAGNOSIS — F17210 Nicotine dependence, cigarettes, uncomplicated: Secondary | ICD-10-CM | POA: Insufficient documentation

## 2018-01-23 DIAGNOSIS — C3490 Malignant neoplasm of unspecified part of unspecified bronchus or lung: Secondary | ICD-10-CM

## 2018-01-23 DIAGNOSIS — M25512 Pain in left shoulder: Secondary | ICD-10-CM | POA: Diagnosis not present

## 2018-01-23 DIAGNOSIS — R0602 Shortness of breath: Secondary | ICD-10-CM | POA: Insufficient documentation

## 2018-01-23 DIAGNOSIS — R911 Solitary pulmonary nodule: Secondary | ICD-10-CM

## 2018-01-23 DIAGNOSIS — Z716 Tobacco abuse counseling: Secondary | ICD-10-CM

## 2018-01-23 NOTE — Telephone Encounter (Signed)
Called patient to schedule pre-op visit prior to Bronchoscopy. Appointment made for 01/26/2018 at 9:30am.

## 2018-01-23 NOTE — Telephone Encounter (Signed)
Scheduled appt per 9/20 los- patient is aware of apt date and time.

## 2018-01-23 NOTE — Progress Notes (Signed)
[place] [phone]   [phone]  CONSULT NOTE  REFERRING PHYSICIAN: Dr. Deno Etienne  REASON FOR CONSULTATION:  57 years old African-American female with questionable lung cancer.  HPI Rhonda Meyers is a 57 y.o. female with no significant past medical history but long history of smoking.  The patient presented to the emergency department on 01/17/2018 complaining of several weeks of cough and shortness of breath as well as sputum production increase in color.  Chest x-ray was performed on 01/17/2018 and showed significant worsening of left lung airspace process with findings worrisome for a central mass and obstructive pneumonitis.  This was followed by CT angiogram scan of the chest and that showed a large ill-defined left perihilar/central lung mass continues with the bulky adenopathy over the left hilum, subcarinal region and AP window.  There was moderate collapse of the left lower lobe with patchy airspace opacification over the lingula.  There was 1.3 cm nodule over the left upper lobe likely metastatic.  There was a small amount of left pleural effusion.  The scan also showed 1.3 cm prevascular lymph node.  Bulky adenopathy over the region of the AP window extending into the left hilum and subcarinal region with a 1.2 cm right paratracheal lymph node. The patient was referred to me today for evaluation and recommendation regarding her condition. When seen today she continues to complain of pain in the left shoulder blade area.  She also has shortness of breath with exertion and cough productive of clear sputum with occasional pinkish color.  She lost around 35 pounds in the last few months.  She denied having any nausea, vomiting, diarrhea or constipation.  She has headache secondary to cough but no blurry vision. Family history significant for mother with rheumatoid fever and maternal grandmother with breast cancer. The patient is single and has no  children.  She was accompanied today by her cousin team.  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.  The patient has a history of smoking 1 pack/day for around 4 years and quit few weeks ago.  She also has a history of alcohol abuse but not recently and no history of drug abuse.  HPI  No past medical history on file.  Past Surgical History:  Procedure Laterality Date  . NO PAST SURGERIES      Family History  Problem Relation Age of Onset  . Cancer Maternal Grandmother 62       breast  . Rheumatic fever Mother   . CAD Mother 72       MI  . Hypertension Sister   . Diabetes Neg Hx   . Stroke Neg Hx     Social History Social History   Tobacco Use  . Smoking status: Current Every Day Smoker    Packs/day: 0.50    Types: Cigarettes    Start date: 05/06/1981  . Smokeless tobacco: Never Used  Substance Use Topics  . Alcohol use: Yes    Comment: 1 bottle wine on weekends  . Drug use: No    No Known Allergies  Current Outpatient Medications  Medication Sig Dispense Refill  . predniSONE (DELTASONE) 20 MG tablet 2 tabs po daily x 4 days 8 tablet 0   No current facility-administered medications for this visit.     Review of Systems  Constitutional: positive for fatigue and weight loss Eyes: negative Ears, nose, mouth, throat, and face: negative Respiratory: positive for cough, dyspnea on exertion  and sputum Cardiovascular: negative Gastrointestinal: negative Genitourinary:negative Integument/breast: negative Hematologic/lymphatic: negative Musculoskeletal:negative Neurological: positive for headaches Behavioral/Psych: negative Endocrine: negative Allergic/Immunologic: negative  Physical Exam  MWU:XLKGM, healthy, no distress, well nourished, well developed and anxious SKIN: skin color, texture, turgor are normal, no rashes or significant lesions HEAD: Normocephalic, No masses, lesions, tenderness or abnormalities EYES: normal, PERRLA,  Conjunctiva are pink and non-injected EARS: External ears normal, Canals clear OROPHARYNX:no exudate, no erythema and lips, buccal mucosa, and tongue normal  NECK: supple, no adenopathy, no JVD LYMPH:  no palpable lymphadenopathy, no hepatosplenomegaly BREAST:not examined LUNGS: clear to auscultation , and palpation HEART: regular rate & rhythm, no murmurs and no gallops ABDOMEN:abdomen soft, non-tender, normal bowel sounds and no masses or organomegaly BACK: Back symmetric, no curvature., No CVA tenderness EXTREMITIES:no joint deformities, effusion, or inflammation, no edema  NEURO: alert & oriented x 3 with fluent speech, no focal motor/sensory deficits  PERFORMANCE STATUS: ECOG 1  LABORATORY DATA: Lab Results  Component Value Date   WBC 16.6 (H) 01/17/2018   HGB 15.1 (H) 01/17/2018   HCT 49.4 (H) 01/17/2018   MCV 88.8 01/17/2018   PLT 315 01/17/2018      Chemistry      Component Value Date/Time   NA 137 01/17/2018 1538   K 4.0 01/17/2018 1538   CL 100 01/17/2018 1538   CO2 25 01/17/2018 1538   BUN 10 01/17/2018 1538   CREATININE 0.88 01/17/2018 1538      Component Value Date/Time   CALCIUM 9.1 01/17/2018 1538       RADIOGRAPHIC STUDIES: Dg Chest 2 View  Result Date: 01/17/2018 CLINICAL DATA:  Cough and chest pain and shortness of breath for 1 month. EXAM: CHEST - 2 VIEW COMPARISON:  Chest x-ray 01/07/2018 FINDINGS: Significant worsening of left lung airspace process when compared the prior study. There is a central appearing "mass ", significant loss of volume in the left hemithorax and diffuse airspace process. I would be very concerned about a central obstructing lesion with obstructive pneumonitis. Recommend chest CT with contrast for further evaluation. The right lung appears clear. IMPRESSION: Significant worsening of left lung airspace process with findings worrisome for a central mass and obstructive pneumonitis. Recommend CT with contrast for further evaluation.  Electronically Signed   By: Marijo Sanes M.D.   On: 01/17/2018 15:09   Dg Chest 2 View  Result Date: 01/07/2018 CLINICAL DATA:  Cough for 2 weeks. EXAM: CHEST - 2 VIEW COMPARISON:  11/15/2013 FINDINGS: There is no focal parenchymal opacity. There is no pleural effusion or pneumothorax. There is stable cardiomegaly. The osseous structures are unremarkable. IMPRESSION: No active cardiopulmonary disease. Electronically Signed   By: Kathreen Devoid   On: 01/07/2018 16:11   Ct Angio Chest Pe W And/or Wo Contrast  Result Date: 01/17/2018 CLINICAL DATA:  Left-sided chest pain and coughing since yesterday. Shortness of breath 1 month. EXAM: CT ANGIOGRAPHY CHEST WITH CONTRAST TECHNIQUE: Multidetector CT imaging of the chest was performed using the standard protocol during bolus administration of intravenous contrast. Multiplanar CT image reconstructions and MIPs were obtained to evaluate the vascular anatomy. CONTRAST:  28mL ISOVUE-370 IOPAMIDOL (ISOVUE-370) INJECTION 76% COMPARISON:  Chest x-ray today. FINDINGS: Cardiovascular: Mild cardiomegaly. Thoracic aorta is otherwise unremarkable. Pulmonary arterial system is within normal without evidence of emboli. Mild compression of the proximal left lower lobar pulmonary artery due to the perihilar mass and mediastinal adenopathy. Mediastinum/Nodes: 1.3 cm prevascular lymph node. Bulky adenopathy over the region of the AP window extending into  the left hilum and subcarinal region. 1.2 cm right paratracheal lymph node. Lungs/Pleura: There is volume loss of the left lung with obstruction of the left mainstem bronchus. No air-filled central left bronchi are demonstrated. There is a large ill-defined left perihilar/central lung mass continuous with the bulky adenopathy over the left hilum, subcarinal region and AP window. Elevated left hemidiaphragm. Moderate collapse of the left lower lobe with patchy airspace opacification over the lingula. 1.3 cm nodule over the left upper  lobe likely metastatic. Small amount left pleural fluid. Right lung is clear. Upper Abdomen: Liver is within normal. Mild prominence of the right adrenal gland. Mild calcification along the medial border of the right lobe of the liver. Musculoskeletal: Unremarkable. Review of the MIP images confirms the above findings. IMPRESSION: Large ill-defined central left perihilar mass continuous with bulky left hilar and mediastinal adenopathy as described likely representing a central primary bronchogenic neoplasm. There is complete obstruction of the left mainstem bronchus with volume loss of the left lung. Small amount of left pleural fluid. Discrete 1.3 cm nodule over the left upper lobe likely metastatic. Recommend tissue sampling for further evaluation. No evidence of pulmonary emboli. Mild cardiomegaly. Electronically Signed   By: Marin Olp M.D.   On: 01/17/2018 19:00    ASSESSMENT: This is a very pleasant 57 years old African-American female presented with large central perihilar mass with bulky left hilar and mediastinal lymphadenopathy as well as complete obstruction of the left mainstem bronchus suspicious for small cell lung cancer versus squamous cell carcinoma of the lung pending further staging work-up and tissue diagnosis.   PLAN: I had a lengthy discussion with the patient and her cousin today about her current disease stage, prognosis and treatment options.  I personally and independently reviewed the scan images and discussed the result and showed the images to the patient today. I recommended for the patient to complete the staging work-up by ordering a PET scan as well as MRI of the brain to rule out metastatic disease. I also referred the patient to Dr. Valeta Harms from pulmonary medicine for consideration of bronchoscopy with tissue biopsy as soon as possible. Because of the obstructive left mainstem bronchus lesion, I referred the patient to radiation oncology for consideration of palliative  radiotherapy to this area. I will see the patient back for follow-up visit few days after her biopsy and staging work-up for more detailed discussion of her treatment options. For smoking cessation, I strongly encouraged the patient to continue quitting smoking. The patient was advised to call immediately if she has any concerning symptoms in the interval. The patient voices understanding of current disease status and treatment options and is in agreement with the current care plan.  All questions were answered. The patient knows to call the clinic with any problems, questions or concerns. We can certainly see the patient much sooner if necessary.  Thank you so much for allowing me to participate in the care of Rhonda Meyers. I will continue to follow up the patient with you and assist in her care.  I spent 40 minutes counseling the patient face to face. The total time spent in the appointment was 60 minutes.  Disclaimer: This note was dictated with voice recognition software. Similar sounding words can inadvertently be transcribed and may not be corrected upon review.   Eilleen Kempf January 23, 2018, 11:22 AM

## 2018-01-23 NOTE — Progress Notes (Signed)
Patient's husband went to [facility] to pick up medications, they have no record, called in.

## 2018-01-23 NOTE — Progress Notes (Signed)
Order for EBUS placed per BI. Nothing further needed at this time.

## 2018-01-25 ENCOUNTER — Encounter: Payer: Self-pay | Admitting: Internal Medicine

## 2018-01-25 ENCOUNTER — Encounter: Payer: Self-pay | Admitting: Pulmonary Disease

## 2018-01-25 DIAGNOSIS — R590 Localized enlarged lymph nodes: Secondary | ICD-10-CM | POA: Insufficient documentation

## 2018-01-25 DIAGNOSIS — R918 Other nonspecific abnormal finding of lung field: Secondary | ICD-10-CM

## 2018-01-25 DIAGNOSIS — Z716 Tobacco abuse counseling: Secondary | ICD-10-CM | POA: Insufficient documentation

## 2018-01-25 HISTORY — DX: Other nonspecific abnormal finding of lung field: R91.8

## 2018-01-25 HISTORY — DX: Localized enlarged lymph nodes: R59.0

## 2018-01-25 NOTE — Progress Notes (Signed)
Synopsis: Referred in September 2019 for lung mass by Dr. Julien Nordmann.  Subjective:   PATIENT ID: Rhonda Meyers GENDER: female DOB: Sep 22, 1960, MRN: [id number]  Chief Complaint  Patient presents with  . Consult    Ref from [facility]. Increased SOB, chest pressure, more so on left side for last couple of weeks, pink tinged sputum. Patinet initially has Bronchitis that developed into PNE.     This is a pleasant 57 year old female with a past medical history significant for long-term smoking.  She presented to the emergency room on 01/17/2018 with complaints of cough, shortness of breath and sputum production.  Subsequent CT imaging revealed a left lower lobe collapse, volume loss concerning for central obstructive lesion.  CT of the chest was obtained which revealed a large left perihilar lung mass and bulky lymphadenopathy.  Patient was referred to pulmonary by Dr. Earlie Server for evaluation and diagnostic bronchoscopy with biopsies.  Patient's only complaint today is that she does feel short of breath with significant exertion.  She has been able to quit smoking since she is received at this new diagnosis.  She is very anxious about everything that is currently happening.  A large portion of the visit today was discussing with the patient as well as her significant other about the plans moving forward and what she should expect over the next few weeks.  We discussed the procedure which is planned for this Wednesday to include bronchoscopy at [facility] for sampling and tissue biopsy.  He does have complaints of pain within the left chest to include some pleuritic pain with deep breaths as well as tightness up underneath her left shoulder blade.  This is making her uncomfortable and makes it very difficult for her to sleep at night.  Patient denies fevers, chills she does have occasional night sweats.  And has lost some weight.  Patient denies hemoptysis.   Past Medical History:  Diagnosis  Date  . Hilar adenopathy 01/25/2018  . Lung mass 01/25/2018  . Mediastinal adenopathy  01/25/2018  . Tobacco abuse      Family History  Problem Relation Age of Onset  . Cancer Maternal Grandmother 31       breast  . Rheumatic fever Mother   . CAD Mother 62       MI  . Hypertension Sister   . Diabetes Neg Hx   . Stroke Neg Hx      Past Surgical History:  Procedure Laterality Date  . NO PAST SURGERIES      Social History   Socioeconomic History  . Marital status: Single    Spouse name: Not on file  . Number of children: Not on file  . Years of education: Not on file  . Highest education level: Not on file  Occupational History  . Not on file  Social Needs  . Financial resource strain: Not on file  . Food insecurity:    Worry: Not on file    Inability: Not on file  . Transportation needs:    Medical: Not on file    Non-medical: Not on file  Tobacco Use  . Smoking status: Current Every Day Smoker    Packs/day: 0.50    Types: Cigarettes    Start date: 05/06/1981  . Smokeless tobacco: Never Used  Substance and Sexual Activity  . Alcohol use: Yes    Comment: 1 bottle wine on weekends  . Drug use: No  . Sexual activity: Not on file  Lifestyle  .  Physical activity:    Days per week: Not on file    Minutes per session: Not on file  . Stress: Not on file  Relationships  . Social connections:    Talks on phone: Not on file    Gets together: Not on file    Attends religious service: Not on file    Active member of club or organization: Not on file    Attends meetings of clubs or organizations: Not on file    Relationship status: Not on file  . Intimate partner violence:    Fear of current or ex partner: Not on file    Emotionally abused: Not on file    Physically abused: Not on file    Forced sexual activity: Not on file  Other Topics Concern  . Not on file  Social History Narrative   Lives alone, no pets   Occupation: housekeeper at [employer]   Activity:  regular walking   Diet: good water, fruit/svegetables daily     No Known Allergies   Outpatient Medications Prior to Visit  Medication Sig Dispense Refill  . albuterol (PROVENTIL HFA;VENTOLIN HFA) 108 (90 Base) MCG/ACT inhaler Inhale 1-2 puffs into the lungs every 6 (six) hours as needed for wheezing or shortness of breath.    . guaiFENesin-dextromethorphan (ROBITUSSIN DM) 100-10 MG/5ML syrup Take 15 mLs by mouth every 4 (four) hours as needed for cough.    . naproxen sodium (ALEVE) 220 MG tablet Take 220-440 mg by mouth 2 (two) times daily as needed (pain).    Marland Kitchen OVER THE COUNTER MEDICATION Take 1 capsule by mouth daily. Supplement containing "black seed", garlic, olive leaf, ginger, cayenne.    . Zinc 50 MG CAPS Take 50 mg by mouth daily.    . predniSONE (DELTASONE) 20 MG tablet 2 tabs po daily x 4 days (Patient not taking: Reported on 01/26/2018) 8 tablet 0   No facility-administered medications prior to visit.     Review of Systems  Constitutional: Positive for weight loss.  HENT: Positive for ear pain and sinus pain.   Eyes: Negative.   Respiratory: Positive for cough, shortness of breath and wheezing.   Cardiovascular: Positive for orthopnea.  Gastrointestinal: Negative.   Genitourinary: Negative.   Musculoskeletal: Negative.   Skin: Negative.   Neurological: Positive for headaches.  Endo/Heme/Allergies: Negative.   Psychiatric/Behavioral: The patient is nervous/anxious.      Objective:  Physical Exam  Constitutional: She is oriented to person, place, and time. She appears well-developed and well-nourished. No distress.  HENT:  Head: Normocephalic and atraumatic.  Mouth/Throat: Oropharynx is clear and moist.  Eyes: Pupils are equal, round, and reactive to light. Conjunctivae are normal. No scleral icterus.  Neck: Neck supple. No JVD present. No tracheal deviation present.  No palpable supraclavicular adenopathy  Cardiovascular: Normal rate, regular rhythm, normal heart  sounds and intact distal pulses.  No murmur heard. Pulmonary/Chest: Effort normal. No accessory muscle usage or stridor. No tachypnea. No respiratory distress. She has no wheezes. She has no rhonchi. She has no rales.  Diminished breath sounds within the left chest.  Decreased airflow with into the left upper lobe.  Abdominal: Soft. Bowel sounds are normal. She exhibits no distension. There is no tenderness.  Musculoskeletal: She exhibits no edema or tenderness.  Lymphadenopathy:    She has no cervical adenopathy.  Neurological: She is alert and oriented to person, place, and time.  Skin: Skin is warm and dry. Capillary refill takes less than 2 seconds. No  rash noted.  Psychiatric: She has a normal mood and affect. Her behavior is normal.  Vitals reviewed.    Vitals:   01/26/18 0938  BP: 118/78  Pulse: (!) 108  SpO2: 95%  Weight: 172 lb 9.6 oz (78.3 kg)  Height: 5\' 1"  (1.549 m)   95% on RA  BMI Readings from Last 3 Encounters:  01/26/18 32.61 kg/m  01/23/18 32.26 kg/m  01/17/18 32.19 kg/m   Wt Readings from Last 3 Encounters:  01/26/18 172 lb 9.6 oz (78.3 kg)  01/23/18 176 lb 6.4 oz (80 kg)  01/17/18 176 lb (79.8 kg)    CBC    Component Value Date/Time   WBC 16.6 (H) 01/17/2018 1538   RBC 5.56 (H) 01/17/2018 1538   HGB 15.1 (H) 01/17/2018 1538   HCT 49.4 (H) 01/17/2018 1538   PLT 315 01/17/2018 1538   MCV 88.8 01/17/2018 1538   MCH 27.2 01/17/2018 1538   MCHC 30.6 01/17/2018 1538   RDW 14.1 01/17/2018 1538   Chest Imaging: 01/17/2018 CT imaging of the chest: Large bulky mediastinal adenopathy and near total occlusion of the left mainstem from extrinsic tumor compression, likely endobronchial disease present. The patient's images have been independently reviewed by me.    Pulmonary Functions Testing Results:  Pathology: None  Echocardiogram: None  Electrocardiogram: 01/17/2018 ECG reviewed from ED visit.  NSR  Heart Catheterization: None     Assessment  & Plan:   Lung mass - Plan: HYDROcodone-acetaminophen (NORCO) 5-325 MG tablet  Mediastinal adenopathy   Hilar adenopathy  Tobacco abuse  Discussion:  This is a 57 year old female with a long-standing history of smoking.  CT imaging reveals a large left-sided perihilar mass, likely left-sided endobronchial disease, extrinsic compression of the left mainstem bronchus as well as bulky mediastinal and hilar adenopathy consistent with an advanced stage bronchogenic carcinoma.  After discussion of risks, benefits, alternatives to diagnosis we will proceed with outpatient endobronchial ultrasound-guided transbronchial needle aspiration biopsies. This has been scheduled for 01/28/2018.  Patient should expect a call from perioperative services at [facility] to instruct her on where to be an time to show up for the procedure on Wednesday morning.  There is no point in obtaining PFTs at this time as will likely show restrictive defect due to left endobronchial narrowing.  Return to clinic in approximately 6 weeks following bronchoscopy.  By this time she should have had a chance to see oncology as well as radiation oncology and start therapy ending her bronchoscopy results.    Current Outpatient Medications:  .  albuterol (PROVENTIL HFA;VENTOLIN HFA) 108 (90 Base) MCG/ACT inhaler, Inhale 1-2 puffs into the lungs every 6 (six) hours as needed for wheezing or shortness of breath., Disp: , Rfl:  .  guaiFENesin-dextromethorphan (ROBITUSSIN DM) 100-10 MG/5ML syrup, Take 15 mLs by mouth every 4 (four) hours as needed for cough., Disp: , Rfl:  .  naproxen sodium (ALEVE) 220 MG tablet, Take 220-440 mg by mouth 2 (two) times daily as needed (pain)., Disp: , Rfl:  .  OVER THE COUNTER MEDICATION, Take 1 capsule by mouth daily. Supplement containing "black seed", garlic, olive leaf, ginger, cayenne., Disp: , Rfl:  .  HYDROcodone-acetaminophen (NORCO) 5-325 MG tablet, Take 1 tablet by mouth every 6 (six) hours  as needed for moderate pain., Disp: 30 tablet, Rfl: 0 .  Zinc 50 MG CAPS, Take 50 mg by mouth daily., Disp: , Rfl:    Garner Nash, DO [facility] 01/26/2018 10:25 AM

## 2018-01-25 NOTE — H&P (View-Only) (Signed)
Synopsis: Referred in September 2019 for lung mass by Dr. Julien Nordmann.  Subjective:   PATIENT ID: Rhonda Meyers GENDER: female DOB: 09/03/1960, MRN: [id number]  Chief Complaint  Patient presents with  . Consult    Ref from [facility]. Increased SOB, chest pressure, more so on left side for last couple of weeks, pink tinged sputum. Patinet initially has Bronchitis that developed into PNE.     This is a pleasant 57 year old female with a past medical history significant for long-term smoking.  She presented to the emergency room on 01/17/2018 with complaints of cough, shortness of breath and sputum production.  Subsequent CT imaging revealed a left lower lobe collapse, volume loss concerning for central obstructive lesion.  CT of the chest was obtained which revealed a large left perihilar lung mass and bulky lymphadenopathy.  Patient was referred to pulmonary by Dr. Earlie Server for evaluation and diagnostic bronchoscopy with biopsies.  Patient's only complaint today is that she does feel short of breath with significant exertion.  She has been able to quit smoking since she is received at this new diagnosis.  She is very anxious about everything that is currently happening.  A large portion of the visit today was discussing with the patient as well as her significant other about the plans moving forward and what she should expect over the next few weeks.  We discussed the procedure which is planned for this Wednesday to include bronchoscopy at [facility] for sampling and tissue biopsy.  He does have complaints of pain within the left chest to include some pleuritic pain with deep breaths as well as tightness up underneath her left shoulder blade.  This is making her uncomfortable and makes it very difficult for her to sleep at night.  Patient denies fevers, chills she does have occasional night sweats.  And has lost some weight.  Patient denies hemoptysis.   Past Medical History:  Diagnosis  Date  . Hilar adenopathy 01/25/2018  . Lung mass 01/25/2018  . Mediastinal adenopathy  01/25/2018  . Tobacco abuse      Family History  Problem Relation Age of Onset  . Cancer Maternal Grandmother 39       breast  . Rheumatic fever Mother   . CAD Mother 83       MI  . Hypertension Sister   . Diabetes Neg Hx   . Stroke Neg Hx      Past Surgical History:  Procedure Laterality Date  . NO PAST SURGERIES      Social History   Socioeconomic History  . Marital status: Single    Spouse name: Not on file  . Number of children: Not on file  . Years of education: Not on file  . Highest education level: Not on file  Occupational History  . Not on file  Social Needs  . Financial resource strain: Not on file  . Food insecurity:    Worry: Not on file    Inability: Not on file  . Transportation needs:    Medical: Not on file    Non-medical: Not on file  Tobacco Use  . Smoking status: Current Every Day Smoker    Packs/day: 0.50    Types: Cigarettes    Start date: 05/06/1981  . Smokeless tobacco: Never Used  Substance and Sexual Activity  . Alcohol use: Yes    Comment: 1 bottle wine on weekends  . Drug use: No  . Sexual activity: Not on file  Lifestyle  .  Physical activity:    Days per week: Not on file    Minutes per session: Not on file  . Stress: Not on file  Relationships  . Social connections:    Talks on phone: Not on file    Gets together: Not on file    Attends religious service: Not on file    Active member of club or organization: Not on file    Attends meetings of clubs or organizations: Not on file    Relationship status: Not on file  . Intimate partner violence:    Fear of current or ex partner: Not on file    Emotionally abused: Not on file    Physically abused: Not on file    Forced sexual activity: Not on file  Other Topics Concern  . Not on file  Social History Narrative   Lives alone, no pets   Occupation: housekeeper at [employer]   Activity:  regular walking   Diet: good water, fruit/svegetables daily     No Known Allergies   Outpatient Medications Prior to Visit  Medication Sig Dispense Refill  . albuterol (PROVENTIL HFA;VENTOLIN HFA) 108 (90 Base) MCG/ACT inhaler Inhale 1-2 puffs into the lungs every 6 (six) hours as needed for wheezing or shortness of breath.    . guaiFENesin-dextromethorphan (ROBITUSSIN DM) 100-10 MG/5ML syrup Take 15 mLs by mouth every 4 (four) hours as needed for cough.    . naproxen sodium (ALEVE) 220 MG tablet Take 220-440 mg by mouth 2 (two) times daily as needed (pain).    Marland Kitchen OVER THE COUNTER MEDICATION Take 1 capsule by mouth daily. Supplement containing "black seed", garlic, olive leaf, ginger, cayenne.    . Zinc 50 MG CAPS Take 50 mg by mouth daily.    . predniSONE (DELTASONE) 20 MG tablet 2 tabs po daily x 4 days (Patient not taking: Reported on 01/26/2018) 8 tablet 0   No facility-administered medications prior to visit.     Review of Systems  Constitutional: Positive for weight loss.  HENT: Positive for ear pain and sinus pain.   Eyes: Negative.   Respiratory: Positive for cough, shortness of breath and wheezing.   Cardiovascular: Positive for orthopnea.  Gastrointestinal: Negative.   Genitourinary: Negative.   Musculoskeletal: Negative.   Skin: Negative.   Neurological: Positive for headaches.  Endo/Heme/Allergies: Negative.   Psychiatric/Behavioral: The patient is nervous/anxious.      Objective:  Physical Exam  Constitutional: She is oriented to person, place, and time. She appears well-developed and well-nourished. No distress.  HENT:  Head: Normocephalic and atraumatic.  Mouth/Throat: Oropharynx is clear and moist.  Eyes: Pupils are equal, round, and reactive to light. Conjunctivae are normal. No scleral icterus.  Neck: Neck supple. No JVD present. No tracheal deviation present.  No palpable supraclavicular adenopathy  Cardiovascular: Normal rate, regular rhythm, normal heart  sounds and intact distal pulses.  No murmur heard. Pulmonary/Chest: Effort normal. No accessory muscle usage or stridor. No tachypnea. No respiratory distress. She has no wheezes. She has no rhonchi. She has no rales.  Diminished breath sounds within the left chest.  Decreased airflow with into the left upper lobe.  Abdominal: Soft. Bowel sounds are normal. She exhibits no distension. There is no tenderness.  Musculoskeletal: She exhibits no edema or tenderness.  Lymphadenopathy:    She has no cervical adenopathy.  Neurological: She is alert and oriented to person, place, and time.  Skin: Skin is warm and dry. Capillary refill takes less than 2 seconds. No  rash noted.  Psychiatric: She has a normal mood and affect. Her behavior is normal.  Vitals reviewed.    Vitals:   01/26/18 0938  BP: 118/78  Pulse: (!) 108  SpO2: 95%  Weight: 172 lb 9.6 oz (78.3 kg)  Height: 5\' 1"  (1.549 m)   95% on RA  BMI Readings from Last 3 Encounters:  01/26/18 32.61 kg/m  01/23/18 32.26 kg/m  01/17/18 32.19 kg/m   Wt Readings from Last 3 Encounters:  01/26/18 172 lb 9.6 oz (78.3 kg)  01/23/18 176 lb 6.4 oz (80 kg)  01/17/18 176 lb (79.8 kg)    CBC    Component Value Date/Time   WBC 16.6 (H) 01/17/2018 1538   RBC 5.56 (H) 01/17/2018 1538   HGB 15.1 (H) 01/17/2018 1538   HCT 49.4 (H) 01/17/2018 1538   PLT 315 01/17/2018 1538   MCV 88.8 01/17/2018 1538   MCH 27.2 01/17/2018 1538   MCHC 30.6 01/17/2018 1538   RDW 14.1 01/17/2018 1538   Chest Imaging: 01/17/2018 CT imaging of the chest: Large bulky mediastinal adenopathy and near total occlusion of the left mainstem from extrinsic tumor compression, likely endobronchial disease present. The patient's images have been independently reviewed by me.    Pulmonary Functions Testing Results:  Pathology: None  Echocardiogram: None  Electrocardiogram: 01/17/2018 ECG reviewed from ED visit.  NSR  Heart Catheterization: None     Assessment  & Plan:   Lung mass - Plan: HYDROcodone-acetaminophen (NORCO) 5-325 MG tablet  Mediastinal adenopathy   Hilar adenopathy  Tobacco abuse  Discussion:  This is a 57 year old female with a long-standing history of smoking.  CT imaging reveals a large left-sided perihilar mass, likely left-sided endobronchial disease, extrinsic compression of the left mainstem bronchus as well as bulky mediastinal and hilar adenopathy consistent with an advanced stage bronchogenic carcinoma.  After discussion of risks, benefits, alternatives to diagnosis we will proceed with outpatient endobronchial ultrasound-guided transbronchial needle aspiration biopsies. This has been scheduled for 01/28/2018.  Patient should expect a call from perioperative services at [facility] to instruct her on where to be an time to show up for the procedure on Wednesday morning.  There is no point in obtaining PFTs at this time as will likely show restrictive defect due to left endobronchial narrowing.  Return to clinic in approximately 6 weeks following bronchoscopy.  By this time she should have had a chance to see oncology as well as radiation oncology and start therapy ending her bronchoscopy results.    Current Outpatient Medications:  .  albuterol (PROVENTIL HFA;VENTOLIN HFA) 108 (90 Base) MCG/ACT inhaler, Inhale 1-2 puffs into the lungs every 6 (six) hours as needed for wheezing or shortness of breath., Disp: , Rfl:  .  guaiFENesin-dextromethorphan (ROBITUSSIN DM) 100-10 MG/5ML syrup, Take 15 mLs by mouth every 4 (four) hours as needed for cough., Disp: , Rfl:  .  naproxen sodium (ALEVE) 220 MG tablet, Take 220-440 mg by mouth 2 (two) times daily as needed (pain)., Disp: , Rfl:  .  OVER THE COUNTER MEDICATION, Take 1 capsule by mouth daily. Supplement containing "black seed", garlic, olive leaf, ginger, cayenne., Disp: , Rfl:  .  HYDROcodone-acetaminophen (NORCO) 5-325 MG tablet, Take 1 tablet by mouth every 6 (six) hours  as needed for moderate pain., Disp: 30 tablet, Rfl: 0 .  Zinc 50 MG CAPS, Take 50 mg by mouth daily., Disp: , Rfl:    Garner Nash, DO [place] [facility] 01/26/2018 10:25 AM

## 2018-01-26 ENCOUNTER — Encounter: Payer: Self-pay | Admitting: Pulmonary Disease

## 2018-01-26 ENCOUNTER — Ambulatory Visit (INDEPENDENT_AMBULATORY_CARE_PROVIDER_SITE_OTHER): Payer: 59 | Admitting: Pulmonary Disease

## 2018-01-26 VITALS — BP 118/78 | HR 108 | Ht 61.0 in | Wt 172.6 lb

## 2018-01-26 DIAGNOSIS — R918 Other nonspecific abnormal finding of lung field: Secondary | ICD-10-CM | POA: Diagnosis not present

## 2018-01-26 DIAGNOSIS — R590 Localized enlarged lymph nodes: Secondary | ICD-10-CM

## 2018-01-26 DIAGNOSIS — Z720 Tobacco use: Secondary | ICD-10-CM

## 2018-01-26 MED ORDER — HYDROCODONE-ACETAMINOPHEN 5-325 MG PO TABS: 1.0000 | ORAL_TABLET | Freq: Four times a day (QID) | ORAL | 0 refills | Status: DC | PRN

## 2018-01-26 NOTE — Patient Instructions (Signed)
Your outpatient bronchoscopy is scheduled for Wednesday, 01/28/2018. You should expect a call from perioperative services from [facility] this week prior to your procedure. We will send orders for a nebulizer. Orders also given for an as needed pain medication.

## 2018-01-26 NOTE — Progress Notes (Signed)
FMLA successfully faxed to [employer] at [phone]. Mailed copy to patient address on file. 

## 2018-01-27 ENCOUNTER — Telehealth: Payer: Self-pay | Admitting: Pulmonary Disease

## 2018-01-27 ENCOUNTER — Other Ambulatory Visit: Payer: Self-pay

## 2018-01-27 ENCOUNTER — Encounter (HOSPITAL_COMMUNITY): Payer: Self-pay | Admitting: *Deleted

## 2018-01-27 NOTE — Telephone Encounter (Signed)
Pre-op orders placed Thanks Garner Nash, DO [place] [facility] 01/27/2018 1:35 PM

## 2018-01-27 NOTE — Telephone Encounter (Signed)
Patient needs orders placed for tomorrow's video bronch.  Routing to Dr. Valeta Harms.

## 2018-01-28 ENCOUNTER — Encounter (HOSPITAL_COMMUNITY)
Admission: RE | Disposition: A | Discharge status: Home / Self Care | Payer: Self-pay | Source: Ambulatory Visit | Attending: Pulmonary Disease

## 2018-01-28 ENCOUNTER — Ambulatory Visit (HOSPITAL_COMMUNITY): Payer: 59 | Admitting: Physician Assistant

## 2018-01-28 ENCOUNTER — Ambulatory Visit (HOSPITAL_COMMUNITY)
Admission: RE | Admit: 2018-01-28 | Discharge: 2018-01-28 | Disposition: A | Discharge status: Home / Self Care | Payer: 59 | Source: Ambulatory Visit | Attending: Pulmonary Disease | Admitting: Pulmonary Disease

## 2018-01-28 ENCOUNTER — Telehealth: Payer: Self-pay | Admitting: Pulmonary Disease

## 2018-01-28 ENCOUNTER — Encounter (HOSPITAL_COMMUNITY): Payer: Self-pay | Admitting: Pulmonary Disease

## 2018-01-28 DIAGNOSIS — R848 Other abnormal findings in specimens from respiratory organs and thorax: Secondary | ICD-10-CM | POA: Diagnosis not present

## 2018-01-28 DIAGNOSIS — R590 Localized enlarged lymph nodes: Secondary | ICD-10-CM | POA: Diagnosis not present

## 2018-01-28 DIAGNOSIS — F17210 Nicotine dependence, cigarettes, uncomplicated: Secondary | ICD-10-CM | POA: Insufficient documentation

## 2018-01-28 DIAGNOSIS — C3411 Malignant neoplasm of upper lobe, right bronchus or lung: Secondary | ICD-10-CM | POA: Diagnosis not present

## 2018-01-28 DIAGNOSIS — Z6833 Body mass index (BMI) 33.0-33.9, adult: Secondary | ICD-10-CM | POA: Insufficient documentation

## 2018-01-28 DIAGNOSIS — E669 Obesity, unspecified: Secondary | ICD-10-CM | POA: Diagnosis not present

## 2018-01-28 DIAGNOSIS — C771 Secondary and unspecified malignant neoplasm of intrathoracic lymph nodes: Secondary | ICD-10-CM | POA: Diagnosis not present

## 2018-01-28 DIAGNOSIS — R918 Other nonspecific abnormal finding of lung field: Secondary | ICD-10-CM

## 2018-01-28 DIAGNOSIS — C3401 Malignant neoplasm of right main bronchus: Secondary | ICD-10-CM | POA: Insufficient documentation

## 2018-01-28 HISTORY — DX: Dyspnea, unspecified: R06.00

## 2018-01-28 HISTORY — PX: VIDEO BRONCHOSCOPY WITH ENDOBRONCHIAL ULTRASOUND: SHX6177

## 2018-01-28 SURGERY — BRONCHOSCOPY, WITH EBUS
Anesthesia: General | Site: Bronchus

## 2018-01-28 MED ORDER — OXYCODONE HCL 5 MG PO TABS: 5.0000 mg | ORAL_TABLET | Freq: Once | ORAL | Status: DC | PRN

## 2018-01-28 MED ORDER — FENTANYL CITRATE (PF) 100 MCG/2ML IJ SOLN
INTRAMUSCULAR | Status: DC | PRN
  Administered 2018-01-28 (×3): 50 ug via INTRAVENOUS

## 2018-01-28 MED ORDER — ROCURONIUM BROMIDE 100 MG/10ML IV SOLN
INTRAVENOUS | Status: DC | PRN
  Administered 2018-01-28: 50 mg via INTRAVENOUS

## 2018-01-28 MED ORDER — LIDOCAINE HCL (CARDIAC) PF 100 MG/5ML IV SOSY
PREFILLED_SYRINGE | INTRAVENOUS | Status: DC | PRN
  Administered 2018-01-28: 60 mg via INTRAVENOUS

## 2018-01-28 MED ORDER — PROPOFOL 10 MG/ML IV BOLUS
INTRAVENOUS | Status: DC | PRN
  Administered 2018-01-28: 150 mg via INTRAVENOUS

## 2018-01-28 MED ORDER — PHENYLEPHRINE HCL 10 MG/ML IJ SOLN
INTRAMUSCULAR | Status: DC | PRN
  Administered 2018-01-28: 80 ug via INTRAVENOUS

## 2018-01-28 MED ORDER — MIDAZOLAM HCL 2 MG/2ML IJ SOLN
INTRAMUSCULAR | Status: AC
  Filled 2018-01-28: qty 2

## 2018-01-28 MED ORDER — LACTATED RINGERS IV SOLN
INTRAVENOUS | Status: DC
  Administered 2018-01-28 (×2): via INTRAVENOUS

## 2018-01-28 MED ORDER — EPINEPHRINE PF 1 MG/ML IJ SOLN
INTRAMUSCULAR | Status: AC
  Filled 2018-01-28: qty 1

## 2018-01-28 MED ORDER — OXYCODONE HCL 5 MG/5ML PO SOLN: 5.0000 mg | Freq: Once | ORAL | Status: DC | PRN

## 2018-01-28 MED ORDER — PROPOFOL 10 MG/ML IV BOLUS
INTRAVENOUS | Status: AC
  Filled 2018-01-28: qty 20

## 2018-01-28 MED ORDER — ALBUTEROL SULFATE (2.5 MG/3ML) 0.083% IN NEBU: 2.5000 mg | INHALATION_SOLUTION | Freq: Four times a day (QID) | RESPIRATORY_TRACT | 12 refills | Status: AC | PRN

## 2018-01-28 MED ORDER — MIDAZOLAM HCL 5 MG/5ML IJ SOLN
INTRAMUSCULAR | Status: DC | PRN
  Administered 2018-01-28: 2 mg via INTRAVENOUS

## 2018-01-28 MED ORDER — SUGAMMADEX SODIUM 200 MG/2ML IV SOLN
INTRAVENOUS | Status: DC | PRN
  Administered 2018-01-28: 200 mg via INTRAVENOUS

## 2018-01-28 MED ORDER — 0.9 % SODIUM CHLORIDE (POUR BTL) OPTIME
TOPICAL | Status: DC | PRN
  Administered 2018-01-28: 1000 mL

## 2018-01-28 MED ORDER — DEXAMETHASONE SODIUM PHOSPHATE 10 MG/ML IJ SOLN
INTRAMUSCULAR | Status: DC | PRN
  Administered 2018-01-28: 10 mg via INTRAVENOUS

## 2018-01-28 MED ORDER — FENTANYL CITRATE (PF) 250 MCG/5ML IJ SOLN
INTRAMUSCULAR | Status: AC
  Filled 2018-01-28: qty 5

## 2018-01-28 MED ORDER — HYDROMORPHONE HCL 1 MG/ML IJ SOLN: 0.2500 mg | INTRAMUSCULAR | Status: DC | PRN

## 2018-01-28 MED ORDER — PROMETHAZINE HCL 25 MG/ML IJ SOLN: 6.2500 mg | INTRAMUSCULAR | Status: DC | PRN

## 2018-01-28 MED ORDER — ONDANSETRON HCL 4 MG/2ML IJ SOLN
INTRAMUSCULAR | Status: DC | PRN
  Administered 2018-01-28: 4 mg via INTRAVENOUS

## 2018-01-28 SURGICAL SUPPLY — 35 items
ADAPTER VALVE BIOPSY EBUS (MISCELLANEOUS) IMPLANT
ADPTR VALVE BIOPSY EBUS (MISCELLANEOUS) ×2
BRUSH CYTOL CELLEBRITY 1.5X140 (MISCELLANEOUS) ×1 IMPLANT
CANISTER SUCT 3000ML PPV (MISCELLANEOUS) ×2 IMPLANT
CONT SPEC 4OZ CLIKSEAL STRL BL (MISCELLANEOUS) ×2 IMPLANT
COVER BACK TABLE 60X90IN (DRAPES) ×2 IMPLANT
COVER DOME SNAP 22 D (MISCELLANEOUS) ×2 IMPLANT
FILTER STRAW FLUID ASPIR (MISCELLANEOUS) IMPLANT
FORCEPS BIOP RJ4 1.8 (CUTTING FORCEPS) ×2 IMPLANT
GAUZE SPONGE 4X4 12PLY STRL (GAUZE/BANDAGES/DRESSINGS) ×2 IMPLANT
GLOVE SURG SS PI 7.5 STRL IVOR (GLOVE) ×2 IMPLANT
GOWN STRL REUS W/ TWL LRG LVL3 (GOWN DISPOSABLE) ×2 IMPLANT
GOWN STRL REUS W/TWL LRG LVL3 (GOWN DISPOSABLE) ×4
KIT CLEAN ENDO COMPLIANCE (KITS) ×4 IMPLANT
KIT TURNOVER KIT B (KITS) ×2 IMPLANT
MARKER SKIN DUAL TIP RULER LAB (MISCELLANEOUS) ×2 IMPLANT
NDL ASPIRATION VIZISHOT 19G (NEEDLE) IMPLANT
NEEDLE ASPIRATION VIZISHOT 19G (NEEDLE) ×2 IMPLANT
NEEDLE SONO TIP II EBUS (NEEDLE) ×2 IMPLANT
NS IRRIG 1000ML POUR BTL (IV SOLUTION) ×2 IMPLANT
OIL SILICONE PENTAX (PARTS (SERVICE/REPAIRS)) ×2 IMPLANT
PAD ARMBOARD 7.5X6 YLW CONV (MISCELLANEOUS) ×4 IMPLANT
STOPCOCK 4 WAY LG BORE MALE ST (IV SETS) ×2 IMPLANT
SYR 20CC LL (SYRINGE) ×2 IMPLANT
SYR 20ML ECCENTRIC (SYRINGE) ×6 IMPLANT
SYR 3ML LL SCALE MARK (SYRINGE) IMPLANT
SYR 50ML SLIP (SYRINGE) ×2 IMPLANT
SYR 5ML LL (SYRINGE) ×2 IMPLANT
TOWEL OR 17X24 6PK STRL BLUE (TOWEL DISPOSABLE) ×2 IMPLANT
TRAP SPECIMEN MUCOUS 40CC (MISCELLANEOUS) ×1 IMPLANT
TUBE CONNECTING 20X1/4 (TUBING) ×2 IMPLANT
VALVE BIOPSY  SINGLE USE (MISCELLANEOUS) ×1
VALVE BIOPSY SINGLE USE (MISCELLANEOUS) IMPLANT
VALVE SUCTION BRONCHIO DISP (MISCELLANEOUS) ×1 IMPLANT
WATER STERILE IRR 1000ML POUR (IV SOLUTION) ×2 IMPLANT

## 2018-01-28 NOTE — Transfer of Care (Signed)
Immediate Anesthesia Transfer of Care Note  Patient: Rhonda Meyers  Procedure(s) Performed: VIDEO BRONCHOSCOPY WITH ENDOBRONCHIAL ULTRASOUND (N/A Bronchus)  Patient Location: PACU  Anesthesia Type:General  Level of Consciousness: awake, alert  and oriented  Airway & Oxygen Therapy: Patient Spontanous Breathing and Patient connected to nasal cannula oxygen  Post-op Assessment: Report given to RN, Post -op Vital signs reviewed and stable and Patient moving all extremities  Post vital signs: Reviewed and stable  Last Vitals:  Vitals Value Taken Time  BP    Temp    Pulse 88 01/28/2018 11:21 AM  Resp 24 01/28/2018 11:21 AM  SpO2 93 % 01/28/2018 11:21 AM  Vitals shown include unvalidated device data.  Last Pain:  Vitals:   01/28/18 0725  TempSrc:   PainSc: 3          Complications: No apparent anesthesia complications

## 2018-01-28 NOTE — Anesthesia Procedure Notes (Signed)
Procedure Name: Intubation Date/Time: 01/28/2018 10:08 AM Performed by: Cherylene Ferrufino T, CRNA Pre-anesthesia Checklist: Patient identified, Emergency Drugs available, Suction available and Patient being monitored Patient Re-evaluated:Patient Re-evaluated prior to induction Oxygen Delivery Method: Circle system utilized Preoxygenation: Pre-oxygenation with 100% oxygen Induction Type: IV induction Ventilation: Mask ventilation without difficulty Laryngoscope Size: Miller and 2 Grade View: Grade II Tube type: Oral Tube size: 8.5 mm Number of attempts: 1 Airway Equipment and Method: Patient positioned with wedge pillow,  Stylet and LTA kit utilized Placement Confirmation: ETT inserted through vocal cords under direct vision,  positive ETCO2 and breath sounds checked- equal and bilateral Secured at: 21 cm Tube secured with: Tape Dental Injury: Teeth and Oropharynx as per pre-operative assessment

## 2018-01-28 NOTE — Op Note (Signed)
Video Bronchoscopy with Endobronchial Ultrasound Procedure Note  Date of Operation: 01/28/2018  Pre-op Diagnosis: Central Obstructing Lung Mass, Left mainstem obstruction, Mediastinal Adenopathy   Post-op Diagnosis: Central Obstructing Lung Mass,  Left mainstem obstruction, Mediastinal Adenopathy   Surgeon: Garner Nash, DO  Assistants: None   Anesthesia: General endotracheal anesthesia  Operation: Flexible video fiberoptic bronchoscopy with endobronchial ultrasound and biopsies.  Estimated Blood Loss: Minimal, <4RD  Complications: None   Indications and History: Rhonda Meyers is a 57 y.o. female with large left perihilar mass, central obstructing mass, obstructing left main stem mass.  The risks, benefits, complications, treatment options and expected outcomes were discussed with the patient.  The possibilities of pneumothorax, pneumonia, reaction to medication, pulmonary aspiration, perforation of a viscus, bleeding, failure to diagnose a condition and creating a complication requiring transfusion or operation were discussed with the patient who freely signed the consent.    Description of Procedure: The patient was examined in the preoperative area and history and data from the preprocedure consultation were reviewed. It was deemed appropriate to proceed.  The patient was taken to [facility] OR 10, identified as Rocco Serene and the procedure verified as Flexible Video Fiberoptic Bronchoscopy.  A Time Out was held and the above information confirmed. After being taken to the operating room general anesthesia was initiated and the patient  was orally intubated. The video fiberoptic bronchoscope was introduced via the endotracheal tube and a general inspection was performed which showed a large left main stem obstructing tumor as well as submucosal infiltrating tumor along the right medial mainstem extending into the bronchus intermedius. The left lung was unable to be visualized due  to tumor involvement. The right lung has normal lung anatomy except for the bronchus intermedius involvement of submucosal tumor. The standard scope was then withdrawn and the endobronchial ultrasound was used to identify and characterize the peritracheal, hilar and bronchial lymph nodes. Inspection showed and enlarged station 7 and 4R. Station 7 really encompassed just extension of the central obstructing mass. Using real-time ultrasound guidance (EBUS) TBNA needle biopsies were take from the central mass as well as station 4R nodes and were sent for cytology. We then switched to a forward viewing scope and completed brushings of the bronchus intermedius and bronchial washings of the right mainstem to be sent for cytology. The patient tolerated the procedure well without apparent complications. There was no significant blood loss. The bronchoscope was withdrawn. Anesthesia was reversed and the patient was taken to the PACU for recovery.   Samples: 1. TBNA X 6, 3 slides and 3 for cell block  2. TBNA X 4 to station 4R  3. Cytology brushing to bronchus intermedius  4. Bronchial washing of the right lung   Plans:  The patient will be discharged from the PACU to home when recovered from anesthesia. We will review the cytology, pathology and microbiology results with the patient when they become available. Outpatient followup will be with me in 4 weeks.  Preliminary Pathology: Malignant cells    Garner Nash, DO [place] [facility] 01/28/2018 11:21 AM  Personal pager: [phone] If unanswered, please page CCM On-call: [phone]

## 2018-01-28 NOTE — Anesthesia Postprocedure Evaluation (Signed)
Anesthesia Post Note  Patient: Rhonda Meyers  Procedure(s) Performed: VIDEO BRONCHOSCOPY WITH ENDOBRONCHIAL ULTRASOUND (N/A Bronchus)     Patient location during evaluation: PACU Anesthesia Type: General Level of consciousness: awake and alert Pain management: pain level controlled Vital Signs Assessment: post-procedure vital signs reviewed and stable Respiratory status: spontaneous breathing, nonlabored ventilation, respiratory function stable and patient connected to nasal cannula oxygen Cardiovascular status: blood pressure returned to baseline and stable Postop Assessment: no apparent nausea or vomiting Anesthetic complications: no    Last Vitals:  Vitals:   01/28/18 1150 01/28/18 1207  BP: 95/71 100/66  Pulse: 87 86  Resp: 19 18  Temp: 36.7 C   SpO2: 91% 90%    Last Pain:  Vitals:   01/28/18 1207  TempSrc:   PainSc: 0-No pain                 Arali Somera P Deaundra Kutzer

## 2018-01-28 NOTE — Telephone Encounter (Signed)
I have talk with the patients husband and let him know I would send in the prescription into the   Pharmacy right away. I have gotten a verbal okay from doctor Icard to do so. Nothing further needed at this time.

## 2018-01-28 NOTE — Interval H&P Note (Signed)
History and Physical Interval Note:  01/28/2018 7:19 AM  Rhonda Meyers  has presented today for surgery, with the diagnosis of RIGHT LUNG MASS  The various methods of treatment have been discussed with the patient and family. After consideration of risks, benefits and other options for treatment, the patient has consented to  Procedure(s): Reader (Right) as a surgical intervention .  The patient's history has been reviewed, patient examined, no change in status, stable for surgery.  I have reviewed the patient's chart and labs.  Questions were answered to the patient's satisfaction.    Discussed again, risks to include bleeding, infection, or even death, as well as benefits and alternatives to today's procedure. Patient doing well this morning. She is accompanied today with her husband. She is anxious but is agreeable to the procedure. All questions were answered. No barriers to proceed.   Garner Nash, DO [place] [facility] 01/28/2018 7:20 AM  Personal pager: [phone] If unanswered, please page CCM On-call: [phone]

## 2018-01-28 NOTE — Discharge Instructions (Signed)
Flexible Bronchoscopy, Care After These instructions give you information on caring for yourself after your procedure. Your doctor may also give you more specific instructions. Call your doctor if you have any problems or questions after your procedure. Follow these instructions at home:  The day after the test, you may eat your normal diet.  You may do your normal activities.  Keep all doctor visits. Get help right away if:  You get more and more short of breath.  You get light-headed.  You feel like you are going to pass out (faint).  You have chest pain.  You have new problems that worry you.  You cough up more than a little blood.  You cough up more blood than before. This information is not intended to replace advice given to you by your health care provider. Make sure you discuss any questions you have with your health care provider. Document Released: 02/17/2009 Document Revised: 09/28/2015 Document Reviewed: 12/25/2012 Elsevier Interactive Patient Education  2017 [place].  Please call us with any questions.  Garner Nash, DO [place] [facility] 01/28/2018 11:31 AM   OFFICE: [phone]

## 2018-01-28 NOTE — Anesthesia Preprocedure Evaluation (Addendum)
Anesthesia Evaluation  Patient identified by MRN, date of birth, ID band Patient awake    Reviewed: Allergy & Precautions, NPO status , Patient's Chart, lab work & pertinent test results  Airway Mallampati: II  TM Distance: >3 FB Neck ROM: Full    Dental  (+) Missing,    Pulmonary former smoker,    Pulmonary exam normal breath sounds clear to auscultation       Cardiovascular negative cardio ROS Normal cardiovascular exam Rhythm:Regular Rate:Normal  ECG: NSR, rate 98   Neuro/Psych negative neurological ROS  negative psych ROS   GI/Hepatic negative GI ROS, Neg liver ROS,   Endo/Other  negative endocrine ROS  Renal/GU negative Renal ROS     Musculoskeletal negative musculoskeletal ROS (+)   Abdominal (+) + obese,   Peds  Hematology negative hematology ROS (+)   Anesthesia Other Findings RIGHT LUNG MASS  Reproductive/Obstetrics                            Anesthesia Physical Anesthesia Plan  ASA: II  Anesthesia Plan: General   Post-op Pain Management:    Induction: Intravenous  PONV Risk Score and Plan: 3 and Ondansetron, Dexamethasone, Midazolam and Treatment may vary due to age or medical condition  Airway Management Planned: Oral ETT  Additional Equipment:   Intra-op Plan:   Post-operative Plan: Extubation in OR  Informed Consent: I have reviewed the patients History and Physical, chart, labs and discussed the procedure including the risks, benefits and alternatives for the proposed anesthesia with the patient or authorized representative who has indicated his/her understanding and acceptance.   Dental advisory given  Plan Discussed with: CRNA  Anesthesia Plan Comments:         Anesthesia Quick Evaluation

## 2018-01-28 NOTE — Progress Notes (Signed)
Dr. Valeta Harms oxygen saturation goal 88 and above.

## 2018-01-29 ENCOUNTER — Encounter (HOSPITAL_COMMUNITY): Payer: Self-pay | Admitting: Pulmonary Disease

## 2018-02-02 NOTE — Progress Notes (Signed)
Thoracic Location of Tumor / Histology: Malignant neoplasm of left main bronchus  Central Perihilar mass with bulky left hilar and mediastinal lymphadenopathy as well as complete obstruction of the left mainstem bronchus suspicious for small cell lung cancer versus squamous cell carcinoma of the lung.  Patient presented to the ER on 01/17/2018 complaining of several weeks of cough and shortness of breath as well as sputum production increase.  She has pain in her left shoulder blade area.  She also has some SOB with exertion and productive cough of clear sputum with occasional pinkish color.  Chest Xray 01/17/2018: significant worsening of left lung airspace process with findings worrisome for a central mass and obstructive pneumonitis.  CTA Chest: large ill defined left perihilar/central lung mass continues with the bulky adenopathy over the left hilum, subcarinal region and AP window.  There was moderate collapse of the left lower lobe with patchy airspace opacification over the lingula.  There was a 1.3 cm nodule over the left upper lobe likely metastatic.  There was a small amount of left pleural effusion.  The scan also showed 1.3 cm pre-vascular lymph node.  Bulky adenopathy over the region of the AP window extending into the left hilum and subcarinal region with a 1.2 cm right paratracheal lymph node.  Biopsies of   Tobacco/Marijuana/Snuff/ETOH use: Previous smoker  Past/Anticipated interventions by pulmonary, if any: Dr. Valeta Harms 01/26/2018 -After discussion of risks, benefits, alternatives to diagnosis we will proceed with outpatient endobronchial ultrasound-guided transbronchial needle aspiration biopsies.  Scheduled for 01/28/2018 -Return to clinic in approximately 6 weeks following bronchoscopy.  Past/Anticipated interventions by cardiothoracic surgery, if any:   Past/Anticipated interventions by medical oncology, if any:  Dr. Julien Nordmann 01/23/2018 -I had a lengthy discussion with the patient  and her cousin today about her current disease stage, prognosis and treatment options. -I recommended for the patient to complete the staging work-up by ordering a PET scan as well as MRI of the brain to rule out  Metastatic disease. -I also referred the patient to Dr. Valeta Harms from pulmonary medicine for consideration of bronchoscopy with tissue biopsy as soon as possible. -I referred the patient to radiation oncology for consideration of palliative radiotherapy to the left mainstem bronchus lesion. -I will see the patient back for follow-up visit a few days after her biopsy and staging work-up for more detailed discussion of the treatment options.   Signs/Symptoms  Weight changes, if any: Lost about 30 pounds from May-current  Respiratory complaints, if any: non-productive cough, no blood noted when it is productive  Hemoptysis, if any: No  Pain issues, if any:  No pain now, since bronchoscopy. SOB occasional, more with activities.  BP 100/72 (BP Location: Right Arm, Patient Position: Sitting)   Pulse 91   Temp 98.3 F (36.8 C) (Oral)   Resp 20   Ht 5\' 1"  (1.549 m)   Wt 174 lb 3.2 oz (79 kg)   LMP 09/03/2013   SpO2 100%   BMI 32.91 kg/m    Wt Readings from Last 3 Encounters:  02/03/18 174 lb 3.2 oz (79 kg)  01/28/18 176 lb (79.8 kg)  01/26/18 172 lb 9.6 oz (78.3 kg)    SAFETY ISSUES:  Prior radiation? No  Pacemaker/ICD? No  Possible current pregnancy? No  Is the patient on methotrexate? No  Current Complaints / other details:

## 2018-02-02 NOTE — Progress Notes (Signed)
FMLA successfully faxed to [employer] at [phone]. Mailed copy to patient address on file. 

## 2018-02-03 ENCOUNTER — Ambulatory Visit
Admission: RE | Admit: 2018-02-03 | Discharge: 2018-02-03 | Disposition: A | Discharge status: Home / Self Care | Payer: 59 | Source: Ambulatory Visit | Attending: Radiation Oncology | Admitting: Radiation Oncology

## 2018-02-03 ENCOUNTER — Other Ambulatory Visit: Payer: Self-pay

## 2018-02-03 ENCOUNTER — Encounter: Payer: Self-pay | Admitting: Radiation Oncology

## 2018-02-03 VITALS — BP 100/72 | HR 91 | Temp 98.3°F | Resp 20 | Ht 61.0 in | Wt 174.2 lb

## 2018-02-03 DIAGNOSIS — R918 Other nonspecific abnormal finding of lung field: Secondary | ICD-10-CM | POA: Diagnosis not present

## 2018-02-03 DIAGNOSIS — F17211 Nicotine dependence, cigarettes, in remission: Secondary | ICD-10-CM | POA: Diagnosis not present

## 2018-02-03 DIAGNOSIS — C3402 Malignant neoplasm of left main bronchus: Secondary | ICD-10-CM

## 2018-02-03 DIAGNOSIS — Z87891 Personal history of nicotine dependence: Secondary | ICD-10-CM | POA: Insufficient documentation

## 2018-02-03 NOTE — Progress Notes (Signed)
Radiation Oncology         [phone] ________________________________  Name: Rhonda Meyers        MRN: [id number]  Date of Service: 02/03/2018 DOB: 29-Jan-1961  [id number], No Pcp Per  Curt Bears, MD     REFERRING PHYSICIAN: Curt Bears, MD   DIAGNOSIS: The encounter diagnosis was Malignant neoplasm of left main bronchus ([place]).   HISTORY OF PRESENT ILLNESS: Rhonda Meyers is a 57 y.o. female seen at the request of Dr. Julien Nordmann for a new diagnosis of lung cancer.  The patient had been noticing gradual weight loss totaling up to about 35 pounds.  She was seen recently on 01/17/2018 with shortness of breath and increasing cough in an urgent care setting.  This revealed a left central lung mass, and she subsequently underwent CT angiogram that revealed a left central mass ill-defined with left lower lobe collapse, bulky mediastinal, paratracheal, hilar and subcarinal adenopathy as well as a concern for an effusion.  She is subsequently undergone bronchoscopy with EBUS with Dr. Valeta Harms on 01/28/2018 siblings from her 5R node, washings and FNA of the primary mass revealed non-small cell lung cancer, favoring adenocarcinoma.  She comes today to discuss options of treatment, and is scheduled to follow-up with Dr. Julien Nordmann to discuss details of her therapy on Thursday.  She is scheduled as well for a brain MRI and PET scan tomorrow.    PREVIOUS RADIATION THERAPY: No   PAST MEDICAL HISTORY:  Past Medical History:  Diagnosis Date  . Dyspnea    WIth activity  . Hilar adenopathy 01/25/2018  . Lung mass 01/25/2018  . Mediastinal adenopathy  01/25/2018  . Tobacco abuse        PAST SURGICAL HISTORY: Past Surgical History:  Procedure Laterality Date  . NO PAST SURGERIES    . VIDEO BRONCHOSCOPY WITH ENDOBRONCHIAL ULTRASOUND N/A 01/28/2018   Procedure: VIDEO BRONCHOSCOPY WITH ENDOBRONCHIAL ULTRASOUND;  Surgeon: Garner Nash, DO;  Location: [facility] OR;  Service: Thoracic;  Laterality:  N/A;     FAMILY HISTORY:  Family History  Problem Relation Age of Onset  . Cancer Maternal Grandmother 10       breast  . Rheumatic fever Mother   . CAD Mother 19       MI  . Hypertension Sister   . Diabetes Neg Hx   . Stroke Neg Hx      SOCIAL HISTORY:  reports that she quit smoking about 2 weeks ago. Her smoking use included cigarettes. She started smoking about 36 years ago. She has a 15.00 pack-year smoking history. She has never used smokeless tobacco. She reports that she drank alcohol. She reports that she does not use drugs.   ALLERGIES: Patient has no known allergies.   MEDICATIONS:  Current Outpatient Medications  Medication Sig Dispense Refill  . albuterol (PROVENTIL HFA;VENTOLIN HFA) 108 (90 Base) MCG/ACT inhaler Inhale 1-2 puffs into the lungs every 6 (six) hours as needed for wheezing or shortness of breath.    Marland Kitchen albuterol (PROVENTIL) (2.5 MG/3ML) 0.083% nebulizer solution Take 3 mLs (2.5 mg total) by nebulization every 6 (six) hours as needed for wheezing or shortness of breath. 75 mL 12  . guaiFENesin-dextromethorphan (ROBITUSSIN DM) 100-10 MG/5ML syrup Take 15 mLs by mouth every 4 (four) hours as needed for cough.    Marland Kitchen HYDROcodone-acetaminophen (NORCO) 5-325 MG tablet Take 1 tablet by mouth every 6 (six) hours as needed for moderate pain. 30 tablet 0  . naproxen sodium (ALEVE) 220 MG tablet Take  220-440 mg by mouth 2 (two) times daily as needed (pain).    Marland Kitchen OVER THE COUNTER MEDICATION Take 1 capsule by mouth daily. Supplement containing "black seed", garlic, olive leaf, ginger, cayenne.    . Zinc 50 MG CAPS Take 50 mg by mouth daily.     No current facility-administered medications for this encounter.      REVIEW OF SYSTEMS: On review of systems, the patient reports that she is doing well overall.  Despite her weight loss, she states that she is feeling pretty well.  She is not experiencing any hemoptysis at this time, she denies any progressive new  musculoskeletal pain but states that since her procedure she has been somewhat sore in her low back.  She states that she is somewhat suspicious of the medical community and believes that many traditional therapies may be unnecessary forms of treatment.  That being said she does participate openly and discussion about the treatment recommendations.  She denies any chest pain, fevers, chills, night sweats.  She does have occasional shortness of breath on exertion.  She continues to have a productive cough with clear mucus.  Denies any bowel or bladder disturbances, and denies abdominal pain, nausea or vomiting. She denies any other new musculoskeletal or joint aches or pains. A complete review of systems is obtained and is otherwise negative.     PHYSICAL EXAM:  Wt Readings from Last 3 Encounters:  02/03/18 174 lb 3.2 oz (79 kg)  01/28/18 176 lb (79.8 kg)  01/26/18 172 lb 9.6 oz (78.3 kg)   Temp Readings from Last 3 Encounters:  02/03/18 98.3 F (36.8 C) (Oral)  01/28/18 98.1 F (36.7 C)  01/23/18 97.8 F (36.6 C) (Oral)   BP Readings from Last 3 Encounters:  02/03/18 100/72  01/28/18 100/66  01/26/18 118/78   Pulse Readings from Last 3 Encounters:  02/03/18 91  01/28/18 86  01/26/18 (!) 108   Pain Assessment Pain Score: 3  Pain Frequency: Intermittent Pain Loc: Back/10  In general this is a well appearing African-American female in no acute distress. She is alert and oriented x4 and appropriate throughout the examination. HEENT reveals that the patient is normocephalic, atraumatic. EOMs are intact. Cardiopulmonary assessment is negative for acute distress and she exhibits normal effort.  Marland Kitchen   ECOG = 1  0 - Asymptomatic (Fully active, able to carry on all predisease activities without restriction)  1 - Symptomatic but completely ambulatory (Restricted in physically strenuous activity but ambulatory and able to carry out work of a light or sedentary nature. For example, light  housework, office work)  2 - Symptomatic, <50% in bed during the day (Ambulatory and capable of all self care but unable to carry out any work activities. Up and about more than 50% of waking hours)  3 - Symptomatic, >50% in bed, but not bedbound (Capable of only limited self-care, confined to bed or chair 50% or more of waking hours)  4 - Bedbound (Completely disabled. Cannot carry on any self-care. Totally confined to bed or chair)  5 - Death   Eustace Pen MM, Creech RH, Tormey DC, et al. [phone]). "Toxicity and response criteria of the [facility] Group". [place] Oncol. 5 (6): 649-55    LABORATORY DATA:  Lab Results  Component Value Date   WBC 16.6 (H) 01/17/2018   HGB 15.1 (H) 01/17/2018   HCT 49.4 (H) 01/17/2018   MCV 88.8 01/17/2018   PLT 315 01/17/2018   Lab Results  Component Value Date   NA 137 01/17/2018   K 4.0 01/17/2018   CL 100 01/17/2018   CO2 25 01/17/2018   No results found for: ALT, AST, GGT, ALKPHOS, BILITOT    RADIOGRAPHY: Dg Chest 2 View  Addendum Date: 01/28/2018   ADDENDUM REPORT: 01/28/2018 08:07 ADDENDUM: Not mentioned above: 3.8 x 3.1 cm opacity in the superior segment of the left upper lobe which has been demonstrated to be a lung mass on CT of the chest dated 01/17/2018. Electronically Signed   By: Kathreen Devoid   On: 01/28/2018 08:07   Result Date: 01/28/2018 CLINICAL DATA:  Cough for 2 weeks. EXAM: CHEST - 2 VIEW COMPARISON:  11/15/2013 FINDINGS: There is no focal parenchymal opacity. There is no pleural effusion or pneumothorax. There is stable cardiomegaly. The osseous structures are unremarkable. IMPRESSION: No active cardiopulmonary disease. Electronically Signed: By: Kathreen Devoid On: 01/07/2018 16:11   Dg Chest 2 View  Result Date: 01/17/2018 CLINICAL DATA:  Cough and chest pain and shortness of breath for 1 month. EXAM: CHEST - 2 VIEW COMPARISON:  Chest x-ray 01/07/2018 FINDINGS: Significant worsening of left lung airspace  process when compared the prior study. There is a central appearing "mass ", significant loss of volume in the left hemithorax and diffuse airspace process. I would be very concerned about a central obstructing lesion with obstructive pneumonitis. Recommend chest CT with contrast for further evaluation. The right lung appears clear. IMPRESSION: Significant worsening of left lung airspace process with findings worrisome for a central mass and obstructive pneumonitis. Recommend CT with contrast for further evaluation. Electronically Signed   By: Marijo Sanes M.D.   On: 01/17/2018 15:09   Ct Angio Chest Pe W And/or Wo Contrast  Result Date: 01/17/2018 CLINICAL DATA:  Left-sided chest pain and coughing since yesterday. Shortness of breath 1 month. EXAM: CT ANGIOGRAPHY CHEST WITH CONTRAST TECHNIQUE: Multidetector CT imaging of the chest was performed using the standard protocol during bolus administration of intravenous contrast. Multiplanar CT image reconstructions and MIPs were obtained to evaluate the vascular anatomy. CONTRAST:  75mL ISOVUE-370 IOPAMIDOL (ISOVUE-370) INJECTION 76% COMPARISON:  Chest x-ray today. FINDINGS: Cardiovascular: Mild cardiomegaly. Thoracic aorta is otherwise unremarkable. Pulmonary arterial system is within normal without evidence of emboli. Mild compression of the proximal left lower lobar pulmonary artery due to the perihilar mass and mediastinal adenopathy. Mediastinum/Nodes: 1.3 cm prevascular lymph node. Bulky adenopathy over the region of the AP window extending into the left hilum and subcarinal region. 1.2 cm right paratracheal lymph node. Lungs/Pleura: There is volume loss of the left lung with obstruction of the left mainstem bronchus. No air-filled central left bronchi are demonstrated. There is a large ill-defined left perihilar/central lung mass continuous with the bulky adenopathy over the left hilum, subcarinal region and AP window. Elevated left hemidiaphragm. Moderate  collapse of the left lower lobe with patchy airspace opacification over the lingula. 1.3 cm nodule over the left upper lobe likely metastatic. Small amount left pleural fluid. Right lung is clear. Upper Abdomen: Liver is within normal. Mild prominence of the right adrenal gland. Mild calcification along the medial border of the right lobe of the liver. Musculoskeletal: Unremarkable. Review of the MIP images confirms the above findings. IMPRESSION: Large ill-defined central left perihilar mass continuous with bulky left hilar and mediastinal adenopathy as described likely representing a central primary bronchogenic neoplasm. There is complete obstruction of the left mainstem bronchus with volume loss of the left lung. Small amount of left pleural fluid.  Discrete 1.3 cm nodule over the left upper lobe likely metastatic. Recommend tissue sampling for further evaluation. No evidence of pulmonary emboli. Mild cardiomegaly. Electronically Signed   By: Marin Olp M.D.   On: 01/17/2018 19:00       IMPRESSION/PLAN: 1. Probable stage III, NSCLC, favor adenocarcinoma of the left lung.  Dr. Lisbeth Renshaw discusses the findings from her previous imaging as well as biopsy results to date.  He recommends proceeding with her PET scan and MRI of the brain which are scheduled for tomorrow.  She will review the findings with Dr. Julien Nordmann on Thursday.  We did discuss that in patients who have stage III disease concurrent chemoradiation would be the standard of care according to NCCN guidelines.  We discussed the delivery, logistics, coordination, as well as side effects both short and long term from radiotherapy.  After considering these discussion points, she is not quite sure that she is interested in aggressive therapy, but she will discuss this further on Thursday. 2. Interested in alternative medicine.  I reviewed with her several resources including about herbs from [place] website.  I discussed  that while some patients are interested in her opinion on natural or holistic approaches towards cancer care.  Oftentimes adjunct therapies could be considered but would need to be cleared with medical oncology.  We also asked her to keep [place] informed of any medication she chooses to take if she were to consider radiation therapy.  In a visit lasting 60 minutes, greater than 50% of the time was spent face to face discussing her case, and coordinating the patient's care.   The above documentation reflects my direct findings during this shared patient visit. Please see the separate note by Dr. Lisbeth Renshaw on this date for the remainder of the patient's plan of care.    Carola Rhine, PAC

## 2018-02-04 ENCOUNTER — Encounter (HOSPITAL_COMMUNITY): Payer: Self-pay

## 2018-02-04 ENCOUNTER — Other Ambulatory Visit: Payer: Self-pay | Admitting: Medical Oncology

## 2018-02-04 ENCOUNTER — Ambulatory Visit (HOSPITAL_COMMUNITY)
Admission: RE | Admit: 2018-02-04 | Discharge: 2018-02-04 | Disposition: A | Discharge status: Home / Self Care | Payer: 59 | Source: Ambulatory Visit | Attending: Internal Medicine | Admitting: Internal Medicine

## 2018-02-04 ENCOUNTER — Telehealth: Payer: Self-pay | Admitting: Medical Oncology

## 2018-02-04 ENCOUNTER — Encounter (HOSPITAL_COMMUNITY)
Admission: RE | Admit: 2018-02-04 | Discharge: 2018-02-04 | Disposition: A | Discharge status: Home / Self Care | Payer: 59 | Source: Ambulatory Visit | Attending: Internal Medicine | Admitting: Internal Medicine

## 2018-02-04 DIAGNOSIS — C7972 Secondary malignant neoplasm of left adrenal gland: Secondary | ICD-10-CM | POA: Insufficient documentation

## 2018-02-04 DIAGNOSIS — C7971 Secondary malignant neoplasm of right adrenal gland: Secondary | ICD-10-CM | POA: Insufficient documentation

## 2018-02-04 DIAGNOSIS — C7951 Secondary malignant neoplasm of bone: Secondary | ICD-10-CM | POA: Diagnosis not present

## 2018-02-04 DIAGNOSIS — C787 Secondary malignant neoplasm of liver and intrahepatic bile duct: Secondary | ICD-10-CM | POA: Diagnosis not present

## 2018-02-04 DIAGNOSIS — R590 Localized enlarged lymph nodes: Secondary | ICD-10-CM | POA: Diagnosis not present

## 2018-02-04 DIAGNOSIS — C3490 Malignant neoplasm of unspecified part of unspecified bronchus or lung: Secondary | ICD-10-CM | POA: Diagnosis not present

## 2018-02-04 DIAGNOSIS — R911 Solitary pulmonary nodule: Secondary | ICD-10-CM

## 2018-02-04 DIAGNOSIS — R918 Other nonspecific abnormal finding of lung field: Secondary | ICD-10-CM | POA: Insufficient documentation

## 2018-02-04 DIAGNOSIS — Z8659 Personal history of other mental and behavioral disorders: Secondary | ICD-10-CM

## 2018-02-04 LAB — GLUCOSE, CAPILLARY: GLUCOSE-CAPILLARY: 88 mg/dL (ref 70–99)

## 2018-02-04 MED ORDER — FLUDEOXYGLUCOSE F - 18 (FDG) INJECTION
8.3000 | Freq: Once | INTRAVENOUS | Status: AC
  Administered 2018-02-04: 8.3 via INTRAVENOUS

## 2018-02-04 MED ORDER — LORAZEPAM 1 MG PO TABS: 1.0000 mg | ORAL_TABLET | Freq: Once | ORAL | 0 refills | Status: AC

## 2018-02-04 NOTE — Telephone Encounter (Signed)
MRI is too small ,clastrophobic. She requests open MRI and something to help her relax. Order faxed to [facility] radiology for open MRI and ativan called in per order from Dr Julien Nordmann.

## 2018-02-04 NOTE — Progress Notes (Signed)
Pt was unable to go into MRI scanner due to claustrophobia. Pt is going to call MD and request wide bore scanner and pre meds. Pt severely claustrophobic.

## 2018-02-05 ENCOUNTER — Other Ambulatory Visit: Payer: 59

## 2018-02-05 ENCOUNTER — Encounter: Payer: Self-pay | Admitting: Internal Medicine

## 2018-02-05 ENCOUNTER — Inpatient Hospital Stay: Payer: 59

## 2018-02-05 ENCOUNTER — Telehealth: Payer: Self-pay

## 2018-02-05 ENCOUNTER — Inpatient Hospital Stay (HOSPITAL_BASED_OUTPATIENT_CLINIC_OR_DEPARTMENT_OTHER): Payer: 59 | Admitting: Internal Medicine

## 2018-02-05 VITALS — BP 93/71 | HR 93 | Temp 98.5°F | Resp 21 | Ht 61.0 in | Wt 174.2 lb

## 2018-02-05 DIAGNOSIS — C7972 Secondary malignant neoplasm of left adrenal gland: Secondary | ICD-10-CM

## 2018-02-05 DIAGNOSIS — J90 Pleural effusion, not elsewhere classified: Secondary | ICD-10-CM

## 2018-02-05 DIAGNOSIS — Z87891 Personal history of nicotine dependence: Secondary | ICD-10-CM

## 2018-02-05 DIAGNOSIS — R918 Other nonspecific abnormal finding of lung field: Secondary | ICD-10-CM

## 2018-02-05 DIAGNOSIS — J9811 Atelectasis: Secondary | ICD-10-CM | POA: Diagnosis not present

## 2018-02-05 DIAGNOSIS — C787 Secondary malignant neoplasm of liver and intrahepatic bile duct: Secondary | ICD-10-CM | POA: Insufficient documentation

## 2018-02-05 DIAGNOSIS — C7951 Secondary malignant neoplasm of bone: Secondary | ICD-10-CM | POA: Insufficient documentation

## 2018-02-05 DIAGNOSIS — M545 Low back pain: Secondary | ICD-10-CM

## 2018-02-05 DIAGNOSIS — C3402 Malignant neoplasm of left main bronchus: Secondary | ICD-10-CM | POA: Diagnosis not present

## 2018-02-05 DIAGNOSIS — R590 Localized enlarged lymph nodes: Secondary | ICD-10-CM

## 2018-02-05 DIAGNOSIS — J920 Pleural plaque with presence of asbestos: Secondary | ICD-10-CM | POA: Diagnosis not present

## 2018-02-05 DIAGNOSIS — C3492 Malignant neoplasm of unspecified part of left bronchus or lung: Secondary | ICD-10-CM | POA: Insufficient documentation

## 2018-02-05 DIAGNOSIS — Z79899 Other long term (current) drug therapy: Secondary | ICD-10-CM

## 2018-02-05 DIAGNOSIS — Z720 Tobacco use: Secondary | ICD-10-CM

## 2018-02-05 DIAGNOSIS — C7971 Secondary malignant neoplasm of right adrenal gland: Secondary | ICD-10-CM | POA: Insufficient documentation

## 2018-02-05 DIAGNOSIS — Z716 Tobacco abuse counseling: Secondary | ICD-10-CM

## 2018-02-05 DIAGNOSIS — Z510 Encounter for antineoplastic radiation therapy: Secondary | ICD-10-CM | POA: Diagnosis not present

## 2018-02-05 LAB — CMP (CANCER CENTER ONLY)
ALBUMIN: 3 g/dL — AB (ref 3.5–5.0)
ALK PHOS: 104 U/L (ref 38–126)
ALT: 10 U/L (ref 0–44)
ANION GAP: 8 (ref 5–15)
AST: 9 U/L — ABNORMAL LOW (ref 15–41)
BILIRUBIN TOTAL: 0.4 mg/dL (ref 0.3–1.2)
BUN: 11 mg/dL (ref 6–20)
CALCIUM: 9.1 mg/dL (ref 8.9–10.3)
CO2: 31 mmol/L (ref 22–32)
Chloride: 105 mmol/L (ref 98–111)
Creatinine: 0.78 mg/dL (ref 0.44–1.00)
GFR, Estimated: >60 mL/min (ref >60)
GLUCOSE: 95 mg/dL (ref 70–99)
Potassium: 3.4 mmol/L — ABNORMAL LOW (ref 3.5–5.1)
Sodium: 144 mmol/L (ref 135–145)
TOTAL PROTEIN: 6.9 g/dL (ref 6.5–8.1)

## 2018-02-05 LAB — CBC WITH DIFFERENTIAL (CANCER CENTER ONLY)
BASOS ABS: 0.1 10*3/uL (ref 0.0–0.1)
BASOS PCT: 0 %
Eosinophils Absolute: 0.2 10*3/uL (ref 0.0–0.5)
Eosinophils Relative: 2 %
HEMATOCRIT: 43 % (ref 34.8–46.6)
Hemoglobin: 13.8 g/dL (ref 11.6–15.9)
Lymphocytes Relative: 13 %
Lymphs Abs: 1.5 10*3/uL (ref 0.9–3.3)
MCH: 27.3 pg (ref 25.1–34.0)
MCHC: 32.1 g/dL (ref 31.5–36.0)
MCV: 85.2 fL (ref 79.5–101.0)
MONO ABS: 0.9 10*3/uL (ref 0.1–0.9)
Monocytes Relative: 8 %
NEUTROS ABS: 8.7 10*3/uL — AB (ref 1.5–6.5)
Neutrophils Relative %: 77 %
PLATELETS: 252 10*3/uL (ref 145–400)
RBC: 5.04 MIL/uL (ref 3.70–5.45)
RDW: 15.3 % — AB (ref 11.2–14.5)
WBC Count: 11.4 10*3/uL — ABNORMAL HIGH (ref 3.9–10.3)

## 2018-02-05 NOTE — Progress Notes (Signed)
[facility] [phone]   [phone]  OFFICE PROGRESS NOTE  Patient, No Pcp Per No address on file  DIAGNOSIS: Stage IV (T3, N3, M1c) non-small cell lung cancer, adenocarcinoma presented with obstructive left lung mass, left hilar and mediastinal lymphadenopathy as well as metastatic disease to the liver, adrenal and bone diagnosed in September 2019  PRIOR THERAPY: None  CURRENT THERAPY: None  INTERVAL HISTORY: Rhonda Meyers 57 y.o. female returns to the clinic today for follow-up visit accompanied by her husband.  The patient continues to have shortness of breath with minimal exertion as well as cough with no hemoptysis.  She also has low back pain.  She denied having any nausea, vomiting, diarrhea or constipation.  She denied having any fever or chills.  She has no weight loss or night sweats.  She has no headache or visual changes.  She had several studies performed recently including a PET scan as well as bronchoscopy with endobronchial ultrasound and biopsy care of Dr. Valeta Harms.  The final pathology was consistent with poorly differentiated carcinoma, favoring adenocarcinoma.  There was insufficient material for molecular studies.  The patient was unable to undergo MRI of the brain because of claustrophobia.  She is here today for evaluation and discussion of her treatment options.  MEDICAL HISTORY: Past Medical History:  Diagnosis Date  . Dyspnea    WIth activity  . Hilar adenopathy 01/25/2018  . Lung mass 01/25/2018  . Mediastinal adenopathy  01/25/2018  . Tobacco abuse     ALLERGIES:  has No Known Allergies.  MEDICATIONS:  Current Outpatient Medications  Medication Sig Dispense Refill  . albuterol (PROVENTIL HFA;VENTOLIN HFA) 108 (90 Base) MCG/ACT inhaler Inhale 1-2 puffs into the lungs every 6 (six) hours as needed for wheezing or shortness of breath.    Marland Kitchen albuterol (PROVENTIL) (2.5 MG/3ML) 0.083% nebulizer solution Take 3 mLs (2.5  mg total) by nebulization every 6 (six) hours as needed for wheezing or shortness of breath. 75 mL 12  . guaiFENesin-dextromethorphan (ROBITUSSIN DM) 100-10 MG/5ML syrup Take 15 mLs by mouth every 4 (four) hours as needed for cough.    Marland Kitchen HYDROcodone-acetaminophen (NORCO) 5-325 MG tablet Take 1 tablet by mouth every 6 (six) hours as needed for moderate pain. 30 tablet 0  . naproxen sodium (ALEVE) 220 MG tablet Take 220-440 mg by mouth 2 (two) times daily as needed (pain).    Marland Kitchen OVER THE COUNTER MEDICATION Take 1 capsule by mouth daily. Supplement containing "black seed", garlic, olive leaf, ginger, cayenne.    . Zinc 50 MG CAPS Take 50 mg by mouth daily.     No current facility-administered medications for this visit.     SURGICAL HISTORY:  Past Surgical History:  Procedure Laterality Date  . NO PAST SURGERIES    . VIDEO BRONCHOSCOPY WITH ENDOBRONCHIAL ULTRASOUND N/A 01/28/2018   Procedure: VIDEO BRONCHOSCOPY WITH ENDOBRONCHIAL ULTRASOUND;  Surgeon: Garner Nash, DO;  Location: [facility] OR;  Service: Thoracic;  Laterality: N/A;    REVIEW OF SYSTEMS:  Constitutional: positive for fatigue Eyes: negative Ears, nose, mouth, throat, and face: negative Respiratory: positive for cough, dyspnea on exertion and wheezing Cardiovascular: negative Gastrointestinal: negative Genitourinary:negative Integument/breast: negative Hematologic/lymphatic: negative Musculoskeletal:positive for back pain Neurological: negative Behavioral/Psych: negative Endocrine: negative Allergic/Immunologic: negative   PHYSICAL EXAMINATION: General appearance: alert, cooperative, fatigued and no distress Head: Normocephalic, without obvious abnormality, atraumatic Neck: no adenopathy, no JVD, supple, symmetrical, trachea midline and thyroid not enlarged, symmetric, no tenderness/mass/nodules  Lymph nodes: Cervical, supraclavicular, and axillary nodes normal. Resp: diminished breath sounds LUL, dullness to percussion LUL  and wheezes bilaterally Back: symmetric, no curvature. ROM normal. No CVA tenderness. Cardio: regular rate and rhythm, S1, S2 normal, no murmur, click, rub or gallop GI: soft, non-tender; bowel sounds normal; no masses,  no organomegaly Extremities: extremities normal, atraumatic, no cyanosis or edema Neurologic: Alert and oriented X 3, normal strength and tone. Normal symmetric reflexes. Normal coordination and gait  ECOG PERFORMANCE STATUS: 1 - Symptomatic but completely ambulatory  Blood pressure 93/71, pulse 93, temperature 98.5 F (36.9 C), temperature source Oral, resp. rate (!) 21, height 5\' 1"  (1.549 m), weight 174 lb 4 oz (79 kg), last menstrual period 09/03/2013, SpO2 99 %.  LABORATORY DATA: Lab Results  Component Value Date   WBC 16.6 (H) 01/17/2018   HGB 15.1 (H) 01/17/2018   HCT 49.4 (H) 01/17/2018   MCV 88.8 01/17/2018   PLT 315 01/17/2018      Chemistry      Component Value Date/Time   NA 137 01/17/2018 1538   K 4.0 01/17/2018 1538   CL 100 01/17/2018 1538   CO2 25 01/17/2018 1538   BUN 10 01/17/2018 1538   CREATININE 0.88 01/17/2018 1538      Component Value Date/Time   CALCIUM 9.1 01/17/2018 1538       RADIOGRAPHIC STUDIES: Dg Chest 2 View  Addendum Date: 01/28/2018   ADDENDUM REPORT: 01/28/2018 08:07 ADDENDUM: Not mentioned above: 3.8 x 3.1 cm opacity in the superior segment of the left upper lobe which has been demonstrated to be a lung mass on CT of the chest dated 01/17/2018. Electronically Signed   By: Kathreen Devoid   On: 01/28/2018 08:07   Result Date: 01/28/2018 CLINICAL DATA:  Cough for 2 weeks. EXAM: CHEST - 2 VIEW COMPARISON:  11/15/2013 FINDINGS: There is no focal parenchymal opacity. There is no pleural effusion or pneumothorax. There is stable cardiomegaly. The osseous structures are unremarkable. IMPRESSION: No active cardiopulmonary disease. Electronically Signed: By: Kathreen Devoid On: 01/07/2018 16:11   Dg Chest 2 View  Result Date:  01/17/2018 CLINICAL DATA:  Cough and chest pain and shortness of breath for 1 month. EXAM: CHEST - 2 VIEW COMPARISON:  Chest x-ray 01/07/2018 FINDINGS: Significant worsening of left lung airspace process when compared the prior study. There is a central appearing "mass ", significant loss of volume in the left hemithorax and diffuse airspace process. I would be very concerned about a central obstructing lesion with obstructive pneumonitis. Recommend chest CT with contrast for further evaluation. The right lung appears clear. IMPRESSION: Significant worsening of left lung airspace process with findings worrisome for a central mass and obstructive pneumonitis. Recommend CT with contrast for further evaluation. Electronically Signed   By: Marijo Sanes M.D.   On: 01/17/2018 15:09   Ct Angio Chest Pe W And/or Wo Contrast  Result Date: 01/17/2018 CLINICAL DATA:  Left-sided chest pain and coughing since yesterday. Shortness of breath 1 month. EXAM: CT ANGIOGRAPHY CHEST WITH CONTRAST TECHNIQUE: Multidetector CT imaging of the chest was performed using the standard protocol during bolus administration of intravenous contrast. Multiplanar CT image reconstructions and MIPs were obtained to evaluate the vascular anatomy. CONTRAST:  26mL ISOVUE-370 IOPAMIDOL (ISOVUE-370) INJECTION 76% COMPARISON:  Chest x-ray today. FINDINGS: Cardiovascular: Mild cardiomegaly. Thoracic aorta is otherwise unremarkable. Pulmonary arterial system is within normal without evidence of emboli. Mild compression of the proximal left lower lobar pulmonary artery due to the perihilar mass  and mediastinal adenopathy. Mediastinum/Nodes: 1.3 cm prevascular lymph node. Bulky adenopathy over the region of the AP window extending into the left hilum and subcarinal region. 1.2 cm right paratracheal lymph node. Lungs/Pleura: There is volume loss of the left lung with obstruction of the left mainstem bronchus. No air-filled central left bronchi are  demonstrated. There is a large ill-defined left perihilar/central lung mass continuous with the bulky adenopathy over the left hilum, subcarinal region and AP window. Elevated left hemidiaphragm. Moderate collapse of the left lower lobe with patchy airspace opacification over the lingula. 1.3 cm nodule over the left upper lobe likely metastatic. Small amount left pleural fluid. Right lung is clear. Upper Abdomen: Liver is within normal. Mild prominence of the right adrenal gland. Mild calcification along the medial border of the right lobe of the liver. Musculoskeletal: Unremarkable. Review of the MIP images confirms the above findings. IMPRESSION: Large ill-defined central left perihilar mass continuous with bulky left hilar and mediastinal adenopathy as described likely representing a central primary bronchogenic neoplasm. There is complete obstruction of the left mainstem bronchus with volume loss of the left lung. Small amount of left pleural fluid. Discrete 1.3 cm nodule over the left upper lobe likely metastatic. Recommend tissue sampling for further evaluation. No evidence of pulmonary emboli. Mild cardiomegaly. Electronically Signed   By: Marin Olp M.D.   On: 01/17/2018 19:00   Nm Pet Image Initial (pi) Skull Base To Thigh  Result Date: 02/04/2018 CLINICAL DATA:  Initial treatment strategy for left hilar mass and adenopathy. EXAM: NUCLEAR MEDICINE PET SKULL BASE TO THIGH TECHNIQUE: 8.3 mCi F-18 FDG was injected intravenously. Full-ring PET imaging was performed from the skull base to thigh after the radiotracer. CT data was obtained and used for attenuation correction and anatomic localization. Fasting blood glucose: 88 mg/dl COMPARISON:  CT chest 01/17/2018 FINDINGS: Mediastinal blood pool activity: SUV max 2.61 NECK: There is asymmetric uptake noted in the right arytenoid area. This is likely due to a paralyzed left vocal cord which appears to be bulging in the midline and likely due to recurrent  laryngeal nerve involvement as it courses under the aortic arch on the left side. No neck mass or lymphadenopathy. Incidental CT findings: none CHEST: Completely drowned/obstructed left lung due to tumor obstructing the left mainstem bronchus. There are 3 hypermetabolic lesions in the left lung. The largest is at the left lung base and measures approximately 4 cm. The SUV max is 13.97. Slightly more superiorly in the left lower lobe is a 3 cm nodule has an SUV max of 19.3. The third lesion is in the left upper lobe and measures approximately 19 mm. SUV max is 9.11. 2 cm left hilar node is hypermetabolic with SUV max of 17.4. Large necrotic appearing subcarinal mass measures 3.6 cm and SUV max is 31.45. This also involves the left mainstem bronchus which is filled with tumor. Two metastatic prevascular lymph nodes are also noted and there is contralateral right paratracheal metastatic adenopathy. No definite metastatic pulmonary nodules are noted in the right lung. Incidental CT findings: Small left pleural effusion. ABDOMEN/PELVIS: Single metastatic lesion in the liver is noted centrally near the caudate lobe. This is difficult to identify on the non-contrast CT scan. It measures approximately 16 mm and the SUV max is 8.12. Bilateral adrenal gland metastasis. The right has an SUV max of 9.41 and the left has an SUV max of 6.13. Small lymph node in the gastrohepatic ligament region has an SUV max of 7.06. Incidental  CT findings: Large anterior abdominal wall hernia containing fat and vessels. Soft tissue density in the hernia is slightly hypermetabolic and may be due to fat necrosis or prior incarceration. No overt tumor. SKELETON: Osseous metastatic disease is demonstrated. There are lesions involving the left clavicle, both upper scapulae, numerous vertebral bodies, transverse processes, ribs, sternum and pelvis. No spinal canal compromise is demonstrated. Incidental CT findings: none IMPRESSION: 1. Completely  drowned/obstructed left lung due to extensive tumor in the left mainstem bronchus with surrounding bulky mediastinal lymphadenopathy. 2. Three left lung lesions along with left hilar, prevascular, AP window, subcarinal and right paratracheal adenopathy. 3. Single hepatic metastatic focus. 4. Bilateral adrenal gland metastasis. 5. Extensive osseous metastatic disease. Electronically Signed   By: Marijo Sanes M.D.   On: 02/04/2018 16:50    ASSESSMENT AND PLAN: This is a very pleasant 57 years old African-American female recently diagnosed with a stage IV non-small cell lung cancer, poorly differentiated favoring adenocarcinoma presented with large obstructive mass in the left upper lobe with hilar and mediastinal lymphadenopathy as well as metastatic disease to the bone, liver and adrenal gland in August and September 2019. I had a lengthy discussion with the patient and her husband today about her current disease stage, prognosis and treatment options. I personally and independently reviewed the scan images and discussed the result and showed the images to the patient and her husband. Unfortunately there was insufficient material for molecular studies.  I will request blood test by [facility] for molecular studies. I recommended for the patient to complete the staging work-up by proceeding with the MRI of the brain and we will provide her with Ativan before the procedure. I also strongly recommend for the patient to proceed with the course of palliative radiotherapy as recommended by Dr. Lisbeth Renshaw to prevent any further obstruction of her airways. I will see the patient back for follow-up visit in 2-3 weeks for more detailed discussion of her systemic treatment options based on the final molecular studies. The patient was advised to call immediately if she has any concerning symptoms in the interval. The patient voices understanding of current disease status and treatment options and is in agreement with  the current care plan.  All questions were answered. The patient knows to call the clinic with any problems, questions or concerns. We can certainly see the patient much sooner if necessary.  I spent 40 minutes counseling the patient face to face. The total time spent in the appointment was 60 minutes.  Disclaimer: This note was dictated with voice recognition software. Similar sounding words can inadvertently be transcribed and may not be corrected upon review.

## 2018-02-05 NOTE — Telephone Encounter (Signed)
Printed avs and calender of upcoming appointment. Per 10/3 los 

## 2018-02-06 ENCOUNTER — Encounter: Payer: Self-pay | Admitting: General Practice

## 2018-02-06 NOTE — Progress Notes (Signed)
[facility] Psychosocial Distress Screening Clinical Social Work  Clinical Social Work was referred by distress screening protocol.  The patient scored a 5 on the Psychosocial Distress Thermometer which indicates moderate distress. Clinical Social Worker contacted patient by phone to assess for distress and other psychosocial needs. CSW and patient discussed common feeling and emotions when being diagnosed with cancer, and the importance of support during treatment. CSW informed patient of the support team and support services at [facility]. CSW provided contact information and encouraged patient to call with any questions or concerns.  Is relying on support of faith community and family, maintaining a positive attitude/optimism.  Works at [employer], is currently on leave, will access short term disability when PAL runs out.  Will discuss need for applying for Social Security disability w HR and contact CSW if she would like help w this process.  Will mail information packet on Support Center services, encouraged patient to try [facility] support group, also to reach out as needed for help/support.  ONCBCN DISTRESS SCREENING 02/03/2018  Screening Type Initial Screening  Distress experienced in past week (1-10) 5  Practical problem type Insurance;Work/school  Emotional problem type Adjusting to illness  Information Concerns Type Lack of info about diagnosis;Lack of info about treatment  Physical Problem type Breathing;Constipation/diarrhea  Other Contact via phone: [phone]    Clinical Social Worker follow up needed: No.  If yes, follow up plan:  Beverely Pace, [place], LCSW Clinical Social Worker Phone:  [phone]

## 2018-02-09 ENCOUNTER — Other Ambulatory Visit: Payer: Self-pay

## 2018-02-09 ENCOUNTER — Emergency Department (HOSPITAL_COMMUNITY): Payer: 59

## 2018-02-09 ENCOUNTER — Observation Stay (HOSPITAL_COMMUNITY)
Admission: EM | Admit: 2018-02-09 | Discharge: 2018-02-10 | Disposition: A | Discharge status: Home / Self Care | Payer: 59 | Attending: Family Medicine | Admitting: Family Medicine

## 2018-02-09 ENCOUNTER — Ambulatory Visit
Admit: 2018-02-09 | Discharge: 2018-02-09 | Disposition: A | Discharge status: Home / Self Care | Payer: 59 | Source: Ambulatory Visit | Attending: Radiation Oncology | Admitting: Radiation Oncology

## 2018-02-09 ENCOUNTER — Encounter (HOSPITAL_COMMUNITY): Payer: Self-pay | Admitting: Emergency Medicine

## 2018-02-09 DIAGNOSIS — C3402 Malignant neoplasm of left main bronchus: Secondary | ICD-10-CM | POA: Diagnosis present

## 2018-02-09 DIAGNOSIS — C7951 Secondary malignant neoplasm of bone: Secondary | ICD-10-CM | POA: Diagnosis not present

## 2018-02-09 DIAGNOSIS — I2694 Multiple subsegmental pulmonary emboli without acute cor pulmonale: Secondary | ICD-10-CM | POA: Diagnosis not present

## 2018-02-09 DIAGNOSIS — R042 Hemoptysis: Secondary | ICD-10-CM

## 2018-02-09 DIAGNOSIS — I2699 Other pulmonary embolism without acute cor pulmonale: Secondary | ICD-10-CM | POA: Diagnosis present

## 2018-02-09 DIAGNOSIS — C787 Secondary malignant neoplasm of liver and intrahepatic bile duct: Secondary | ICD-10-CM | POA: Diagnosis not present

## 2018-02-09 DIAGNOSIS — Z87891 Personal history of nicotine dependence: Secondary | ICD-10-CM | POA: Insufficient documentation

## 2018-02-09 DIAGNOSIS — R0602 Shortness of breath: Secondary | ICD-10-CM | POA: Diagnosis not present

## 2018-02-09 DIAGNOSIS — C3492 Malignant neoplasm of unspecified part of left bronchus or lung: Secondary | ICD-10-CM | POA: Diagnosis not present

## 2018-02-09 DIAGNOSIS — Z79899 Other long term (current) drug therapy: Secondary | ICD-10-CM | POA: Diagnosis not present

## 2018-02-09 DIAGNOSIS — C771 Secondary and unspecified malignant neoplasm of intrathoracic lymph nodes: Secondary | ICD-10-CM | POA: Diagnosis not present

## 2018-02-09 DIAGNOSIS — G893 Neoplasm related pain (acute) (chronic): Secondary | ICD-10-CM | POA: Insufficient documentation

## 2018-02-09 DIAGNOSIS — C7972 Secondary malignant neoplasm of left adrenal gland: Secondary | ICD-10-CM | POA: Diagnosis not present

## 2018-02-09 DIAGNOSIS — R079 Chest pain, unspecified: Secondary | ICD-10-CM | POA: Diagnosis not present

## 2018-02-09 DIAGNOSIS — C7971 Secondary malignant neoplasm of right adrenal gland: Secondary | ICD-10-CM | POA: Insufficient documentation

## 2018-02-09 DIAGNOSIS — Z720 Tobacco use: Secondary | ICD-10-CM | POA: Diagnosis present

## 2018-02-09 DIAGNOSIS — Z510 Encounter for antineoplastic radiation therapy: Secondary | ICD-10-CM | POA: Insufficient documentation

## 2018-02-09 DIAGNOSIS — M549 Dorsalgia, unspecified: Secondary | ICD-10-CM | POA: Diagnosis not present

## 2018-02-09 HISTORY — DX: Malignant (primary) neoplasm, unspecified: C80.1

## 2018-02-09 LAB — CBC WITH DIFFERENTIAL/PLATELET
BASOS PCT: 0 %
Basophils Absolute: 0 10*3/uL (ref 0.0–0.1)
Eosinophils Absolute: 0.2 10*3/uL (ref 0.0–0.7)
Eosinophils Relative: 1 %
HEMATOCRIT: 43 % (ref 36.0–46.0)
Hemoglobin: 14.1 g/dL (ref 12.0–15.0)
LYMPHS ABS: 1.6 10*3/uL (ref 0.7–4.0)
LYMPHS PCT: 13 %
MCH: 28.5 pg (ref 26.0–34.0)
MCHC: 32.8 g/dL (ref 30.0–36.0)
MCV: 86.9 fL (ref 78.0–100.0)
MONOS PCT: 8 %
Monocytes Absolute: 1 10*3/uL (ref 0.1–1.0)
NEUTROS PCT: 78 %
Neutro Abs: 9.7 10*3/uL — ABNORMAL HIGH (ref 1.7–7.7)
Platelets: 249 10*3/uL (ref 150–400)
RBC: 4.95 MIL/uL (ref 3.87–5.11)
RDW: 14.8 % (ref 11.5–15.5)
WBC: 12.5 10*3/uL — ABNORMAL HIGH (ref 4.0–10.5)

## 2018-02-09 LAB — COMPREHENSIVE METABOLIC PANEL
ALBUMIN: 3.2 g/dL — AB (ref 3.5–5.0)
ALK PHOS: 82 U/L (ref 38–126)
ALT: 11 U/L (ref 0–44)
ANION GAP: 8 (ref 5–15)
AST: 11 U/L — ABNORMAL LOW (ref 15–41)
BILIRUBIN TOTAL: 0.7 mg/dL (ref 0.3–1.2)
BUN: 11 mg/dL (ref 6–20)
CALCIUM: 9.2 mg/dL (ref 8.9–10.3)
CO2: 28 mmol/L (ref 22–32)
Chloride: 103 mmol/L (ref 98–111)
Creatinine, Ser: 0.8 mg/dL (ref 0.44–1.00)
GFR calc Af Amer: >60 mL/min (ref >60)
GFR calc non Af Amer: >60 mL/min (ref >60)
GLUCOSE: 91 mg/dL (ref 70–99)
Potassium: 4 mmol/L (ref 3.5–5.1)
Sodium: 139 mmol/L (ref 135–145)
TOTAL PROTEIN: 7.1 g/dL (ref 6.5–8.1)

## 2018-02-09 LAB — PROTIME-INR
INR: 1
Prothrombin Time: 13.1 seconds (ref 11.4–15.2)

## 2018-02-09 LAB — TROPONIN I: Troponin I: <0.03 ng/mL (ref <0.03)

## 2018-02-09 LAB — BRAIN NATRIURETIC PEPTIDE: B Natriuretic Peptide: 29.4 pg/mL (ref 0.0–100.0)

## 2018-02-09 LAB — APTT: APTT: 27 s (ref 24–36)

## 2018-02-09 LAB — HEPARIN LEVEL (UNFRACTIONATED): HEPARIN UNFRACTIONATED: 0.19 [IU]/mL — AB (ref 0.30–0.70)

## 2018-02-09 MED ORDER — ACETAMINOPHEN 650 MG RE SUPP: 650.0000 mg | Freq: Four times a day (QID) | RECTAL | Status: DC | PRN

## 2018-02-09 MED ORDER — ONDANSETRON HCL 4 MG/2ML IJ SOLN: 4.0000 mg | Freq: Four times a day (QID) | INTRAMUSCULAR | Status: DC | PRN

## 2018-02-09 MED ORDER — SENNOSIDES-DOCUSATE SODIUM 8.6-50 MG PO TABS
1.0000 | ORAL_TABLET | Freq: Two times a day (BID) | ORAL | Status: DC
  Administered 2018-02-09 – 2018-02-10 (×3): 1 via ORAL
  Filled 2018-02-09 (×3): qty 1

## 2018-02-09 MED ORDER — SODIUM CHLORIDE 0.9 % IJ SOLN
INTRAMUSCULAR | Status: AC
  Filled 2018-02-09: qty 50

## 2018-02-09 MED ORDER — ACETAMINOPHEN 325 MG PO TABS: 650.0000 mg | ORAL_TABLET | Freq: Four times a day (QID) | ORAL | Status: DC | PRN

## 2018-02-09 MED ORDER — ALBUTEROL SULFATE HFA 108 (90 BASE) MCG/ACT IN AERS: 1.0000 | INHALATION_SPRAY | Freq: Four times a day (QID) | RESPIRATORY_TRACT | Status: DC | PRN

## 2018-02-09 MED ORDER — IOPAMIDOL (ISOVUE-370) INJECTION 76%
100.0000 mL | Freq: Once | INTRAVENOUS | Status: AC | PRN
  Administered 2018-02-09: 100 mL via INTRAVENOUS

## 2018-02-09 MED ORDER — IOPAMIDOL (ISOVUE-370) INJECTION 76%
INTRAVENOUS | Status: AC
  Filled 2018-02-09: qty 100

## 2018-02-09 MED ORDER — ONDANSETRON HCL 4 MG PO TABS: 4.0000 mg | ORAL_TABLET | Freq: Four times a day (QID) | ORAL | Status: DC | PRN

## 2018-02-09 MED ORDER — HEPARIN (PORCINE) IN NACL 100-0.45 UNIT/ML-% IJ SOLN
1350.0000 [IU]/h | INTRAMUSCULAR | Status: DC
  Administered 2018-02-09: 1350 [IU]/h via INTRAVENOUS
  Filled 2018-02-09 (×2): qty 250

## 2018-02-09 MED ORDER — OXYCODONE-ACETAMINOPHEN 5-325 MG PO TABS
1.0000 | ORAL_TABLET | ORAL | Status: DC | PRN
  Administered 2018-02-09 (×2): 1 via ORAL
  Administered 2018-02-10: 2 via ORAL
  Administered 2018-02-10: 1 via ORAL
  Filled 2018-02-09: qty 2
  Filled 2018-02-09 (×2): qty 1
  Filled 2018-02-09: qty 2

## 2018-02-09 MED ORDER — MORPHINE SULFATE (PF) 2 MG/ML IV SOLN: 2.0000 mg | INTRAVENOUS | Status: DC | PRN

## 2018-02-09 MED ORDER — BENZONATATE 100 MG PO CAPS
100.0000 mg | ORAL_CAPSULE | Freq: Three times a day (TID) | ORAL | Status: DC
  Administered 2018-02-09 – 2018-02-10 (×4): 100 mg via ORAL
  Filled 2018-02-09 (×4): qty 1

## 2018-02-09 MED ORDER — ALBUTEROL SULFATE (2.5 MG/3ML) 0.083% IN NEBU: 2.5000 mg | INHALATION_SOLUTION | Freq: Four times a day (QID) | RESPIRATORY_TRACT | Status: DC | PRN

## 2018-02-09 MED ORDER — POLYETHYLENE GLYCOL 3350 17 G PO PACK
17.0000 g | PACK | Freq: Every day | ORAL | Status: DC
  Administered 2018-02-09 – 2018-02-10 (×2): 17 g via ORAL
  Filled 2018-02-09 (×2): qty 1

## 2018-02-09 MED ORDER — GUAIFENESIN-DM 100-10 MG/5ML PO SYRP
15.0000 mL | ORAL_SOLUTION | ORAL | Status: DC | PRN
  Administered 2018-02-10: 15 mL via ORAL
  Filled 2018-02-09: qty 20

## 2018-02-09 MED ORDER — HEPARIN (PORCINE) IN NACL 100-0.45 UNIT/ML-% IJ SOLN
1150.0000 [IU]/h | INTRAMUSCULAR | Status: DC
  Administered 2018-02-09: 1150 [IU]/h via INTRAVENOUS
  Filled 2018-02-09: qty 250

## 2018-02-09 NOTE — ED Provider Notes (Signed)
[facility] DEPT Provider Note   CSN: [id number] Arrival date & time: 02/09/18  0909     History   Chief Complaint Chief Complaint  Patient presents with  . Hemoptysis    HPI Rhonda Meyers is a 57 y.o. female.  HPI   Presents with hemoptysis, coughed up mucous, yellow with dark blood mixed in, a few dime size amounts. No frank hemoptysis.  Left sided back pain since having bronchoscopy, 9/25 Pain with coughing also present since bronchoscopy Taking hydrocodone but pain continuing No chest pain Shortness of breath since bronchoscopy every once in a while, no severe worsening of symptoms today. No fevers. Cough about the same as it has been  Past Medical History:  Diagnosis Date  . Cancer ([place])   . Dyspnea    WIth activity  . Hilar adenopathy 01/25/2018  . Lung mass 01/25/2018  . Mediastinal adenopathy  01/25/2018  . Tobacco abuse     Patient Active Problem List   Diagnosis Date Noted  . Pulmonary embolism ([place]) 02/09/2018  . Cancer related pain 02/09/2018  . Adenocarcinoma of left lung, stage 4 ([place]) 02/05/2018  . Encounter for smoking cessation counseling 01/25/2018  . Lung mass 01/25/2018  . Mediastinal adenopathy  01/25/2018  . Hilar adenopathy 01/25/2018  . Tobacco abuse 11/15/2013  . Other nonspecific abnormal finding of lung field 11/15/2013  . Perimenopause 11/15/2013    Past Surgical History:  Procedure Laterality Date  . NO PAST SURGERIES    . VIDEO BRONCHOSCOPY WITH ENDOBRONCHIAL ULTRASOUND N/A 01/28/2018   Procedure: VIDEO BRONCHOSCOPY WITH ENDOBRONCHIAL ULTRASOUND;  Surgeon: Garner Nash, DO;  Location: [facility] OR;  Service: Thoracic;  Laterality: N/A;     OB History   None      Home Medications    Prior to Admission medications   Medication Sig Start Date End Date Taking? Authorizing Provider  albuterol (PROVENTIL HFA;VENTOLIN HFA) 108 (90 Base) MCG/ACT inhaler Inhale 1-2 puffs into the lungs every 6  (six) hours as needed for wheezing or shortness of breath.   Yes [provider]  albuterol (PROVENTIL) (2.5 MG/3ML) 0.083% nebulizer solution Take 3 mLs (2.5 mg total) by nebulization every 6 (six) hours as needed for wheezing or shortness of breath. 01/28/18  Yes Icard, Bradley L, DO  guaiFENesin-dextromethorphan (ROBITUSSIN DM) 100-10 MG/5ML syrup Take 15 mLs by mouth every 4 (four) hours as needed for cough.   Yes [provider]  HYDROcodone-acetaminophen (NORCO) 5-325 MG tablet Take 1 tablet by mouth every 6 (six) hours as needed for moderate pain. 01/26/18  Yes Icard, Bradley L, DO  naproxen sodium (ALEVE) 220 MG tablet Take 220-440 mg by mouth 2 (two) times daily as needed (pain).   Yes [provider]  OVER THE COUNTER MEDICATION Take 1 capsule by mouth daily. Supplement containing "black seed", garlic, olive leaf, ginger, cayenne.   Yes [provider]  Zinc 50 MG CAPS Take 50 mg by mouth daily.   Yes [provider]    Family History Family History  Problem Relation Age of Onset  . Cancer Maternal Grandmother 50       breast  . Rheumatic fever Mother   . CAD Mother 40       MI  . Hypertension Sister   . Diabetes Neg Hx   . Stroke Neg Hx     Social History Social History   Tobacco Use  . Smoking status: Former Smoker    Packs/day: 0.50  Years: 30.00    Pack years: 15.00    Types: Cigarettes    Start date: 05/06/1981    Last attempt to quit: 01/17/2018    Years since quitting: 0.0  . Smokeless tobacco: Never Used  Substance Use Topics  . Alcohol use: Not Currently    Comment: 1 bottle wine on weekends  . Drug use: No     Allergies   Patient has no known allergies.   Review of Systems Review of Systems  Constitutional: Negative for fever.  HENT: Negative for sore throat.   Eyes: Negative for visual disturbance.  Respiratory: Positive for cough and shortness of breath.   Cardiovascular: Positive for chest pain.    Gastrointestinal: Negative for abdominal pain, nausea and vomiting.  Genitourinary: Negative for difficulty urinating.  Musculoskeletal: Positive for back pain. Negative for neck pain.  Skin: Negative for rash.  Neurological: Negative for syncope and headaches.     Physical Exam Updated Vital Signs BP 109/75   Pulse 80   Temp 98.2 F (36.8 C) (Oral)   Resp (!) 22   LMP 09/03/2013   SpO2 96%   Physical Exam  Constitutional: She is oriented to person, place, and time. She appears well-developed and well-nourished. No distress.  HENT:  Head: Normocephalic and atraumatic.  Eyes: Conjunctivae and EOM are normal.  Neck: Normal range of motion.  Cardiovascular: Normal rate, regular rhythm, normal heart sounds and intact distal pulses. Exam reveals no gallop and no friction rub.  No murmur heard. Pulmonary/Chest: Effort normal. No respiratory distress. She has decreased breath sounds in the left upper field, the left middle field and the left lower field. She has no wheezes. She has no rales.  Abdominal: Soft. She exhibits no distension. There is no tenderness. There is no guarding.  Musculoskeletal: She exhibits no edema or tenderness.  Neurological: She is alert and oriented to person, place, and time.  Skin: Skin is warm and dry. No rash noted. She is not diaphoretic. No erythema.  Nursing note and vitals reviewed.    ED Treatments / Results  Labs (all labs ordered are listed, but only abnormal results are displayed) Labs Reviewed  CBC WITH DIFFERENTIAL/PLATELET - Abnormal; Notable for the following components:      Result Value   WBC 12.5 (*)    Neutro Abs 9.7 (*)    All other components within normal limits  COMPREHENSIVE METABOLIC PANEL - Abnormal; Notable for the following components:   Albumin 3.2 (*)    AST 11 (*)    All other components within normal limits  HEPARIN LEVEL (UNFRACTIONATED) - Abnormal; Notable for the following components:   Heparin Unfractionated  0.19 (*)    All other components within normal limits  BRAIN NATRIURETIC PEPTIDE  TROPONIN I  PROTIME-INR  APTT  CBC  COMPREHENSIVE METABOLIC PANEL  HIV ANTIBODY (ROUTINE TESTING W REFLEX)  HEPARIN LEVEL (UNFRACTIONATED)    EKG None  Radiology Dg Chest 2 View  Result Date: 02/09/2018 CLINICAL DATA:  Stage IV lung the ligament C. New onset hemoptysis this morning associated with shortness of breath and posterior chest pain. Former smoker. EXAM: CHEST - 2 VIEW COMPARISON:  CT scan of the chest of February 04, 2018 and PA and lateral chest x-ray of January 17, 2018 FINDINGS: Opacification of the left hemithorax is nearly total similar to that seen on the recent CT scan. There is some shift of the mediastinum toward the left. The right lung is well-expanded and clear. The observed bony  thorax is unremarkable. IMPRESSION: Near total atelectasis of the left lung with large left pleural effusion consistent with a central obstructing lesion. And mild shift of the mediastinum from right to left. Clear right lung. Electronically Signed   By: David  Martinique M.D.   On: 02/09/2018 10:57   Ct Angio Chest Pe W And/or Wo Contrast  Result Date: 02/09/2018 CLINICAL DATA:  Hemoptysis, shortness of breath EXAM: CT ANGIOGRAPHY CHEST WITH CONTRAST TECHNIQUE: Multidetector CT imaging of the chest was performed using the standard protocol during bolus administration of intravenous contrast. Multiplanar CT image reconstructions and MIPs were obtained to evaluate the vascular anatomy. CONTRAST:  162mL ISOVUE-370 IOPAMIDOL (ISOVUE-370) INJECTION 76% COMPARISON:  Chest x-ray 02/09/2018. Chest CT 01/17/2018. PET CT 02/04/2018. FINDINGS: Cardiovascular: Heart is normal size. Visualized aorta normal caliber. Filling defects are noted within right lower lobe pulmonary arterial branches compatible with nonocclusive pulmonary embolus. No evidence of right heart strain Mediastinum/Nodes: Bulky right AP window nodal mass again  noted, measuring 4.2 cm compared to 3.6 cm on prior PET CT. Other enlarged mediastinal lymph nodes in the right paratracheal region, subcarinal region and prevascular regions. Lungs/Pleura: Complete drowned lung on the left again noted, stable since prior PET CT due to occlusion of the left mainstem bronchus related to tumor and adenopathy. Moderate left pleural effusion. Right lung is clear. Upper Abdomen: Right adrenal nodule noted, stable. Musculoskeletal: Chest wall soft tissues are unremarkable. Destructive lytic lesion noted in the left 12th medial rib at the costovertebral junction on image 119 of series 7. Central lucent lesion noted in the the T1 vertebral body compatible with metastasis. Review of the MIP images confirms the above findings. IMPRESSION: Drowned lung on the left due to obstructing tumor and adenopathy obstructing the left mainstem bronchus. Moderate left pleural effusion. Right lung clear. Nonocclusive pulmonary emboli noted in the right lower lobe pulmonary artery. No evidence of right heart strain. Bony metastatic disease involving the medial left 12th rib and the T1 vertebral body. These results were called by telephone at the time of interpretation on 02/09/2018 at 11:32 am to Dr. Gareth Morgan , who verbally acknowledged these results. Electronically Signed   By: Rolm Baptise M.D.   On: 02/09/2018 11:33    Procedures .Critical Care Performed by: Gareth Morgan, MD Authorized by: Gareth Morgan, MD   Critical care provider statement:    Critical care time (minutes):  30   Critical care was necessary to treat or prevent imminent or life-threatening deterioration of the following conditions:  Respiratory failure   Critical care was time spent personally by me on the following activities:  Ordering and review of radiographic studies, ordering and performing treatments and interventions, ordering and review of laboratory studies and pulse oximetry   (including critical care  time)  Medications Ordered in ED Medications  sodium chloride 0.9 % injection (has no administration in time range)  iopamidol (ISOVUE-370) 76 % injection (has no administration in time range)  albuterol (PROVENTIL) (2.5 MG/3ML) 0.083% nebulizer solution 2.5 mg (has no administration in time range)  guaiFENesin-dextromethorphan (ROBITUSSIN DM) 100-10 MG/5ML syrup 15 mL (has no administration in time range)  oxyCODONE-acetaminophen (PERCOCET/ROXICET) 5-325 MG per tablet 1-2 tablet (1 tablet Oral Given 02/09/18 2001)  acetaminophen (TYLENOL) tablet 650 mg (has no administration in time range)    Or  acetaminophen (TYLENOL) suppository 650 mg (has no administration in time range)  polyethylene glycol (MIRALAX / GLYCOLAX) packet 17 g (17 g Oral Given 02/09/18 1543)  ondansetron (ZOFRAN) tablet 4  mg (has no administration in time range)    Or  ondansetron (ZOFRAN) injection 4 mg (has no administration in time range)  senna-docusate (Senokot-S) tablet 1 tablet (1 tablet Oral Given 02/09/18 1543)  benzonatate (TESSALON) capsule 100 mg (100 mg Oral Given 02/09/18 1407)  morphine 2 MG/ML injection 2 mg (has no administration in time range)  heparin ADULT infusion 100 units/mL (25000 units/217mL sodium chloride 0.45%) (1,350 Units/hr Intravenous New Bag/Given 02/09/18 2009)  iopamidol (ISOVUE-370) 76 % injection 100 mL (100 mLs Intravenous Contrast Given 02/09/18 1110)     Initial Impression / Assessment and Plan / ED Course  I have reviewed the triage vital signs and the nursing notes.  Pertinent labs & imaging results that were available during my care of the patient were reviewed by me and considered in my medical decision making (see chart for details).    57 year old female with a history of non-small cell lung cancer adenocarcinoma of the left lung with metastases to the liver, adrenal glands and bone, who presents with concern for hemoptysis.  Differential diagnosis includes pulmonary embolus,  hemoptysis related to tumor, pneumonia, bronchitis.  X-ray shows near total atelectasis of left lung with pleural effusion.    CT PE study completed showing obstructing tumor, adenopathy, pleural effusion, and nonocclusive pulmonary emboli on the right.  Started on heparin. Will admit for further care of PE and hemoptysis.   Final Clinical Impressions(s) / ED Diagnoses   Final diagnoses:  Multiple subsegmental pulmonary emboli without acute cor pulmonale    ED Discharge Orders    None       Gareth Morgan, MD 02/09/18 2101

## 2018-02-09 NOTE — Progress Notes (Signed)
[facility] for IV heparin Indication: pulmonary embolus  No Known Allergies  Patient Measurements:   Heparin Dosing Weight: used [place] nomogram  Vital Signs: Temp: 98.3 F (36.8 C) (10/07 0917) Temp Source: Oral (10/07 0917) BP: 107/80 (10/07 1100) Pulse Rate: 85 (10/07 1100)  Labs: Recent Labs    02/09/18 1005  HGB 14.1  HCT 43.0  PLT 249  CREATININE 0.80  TROPONINI <0.03    Estimated Creatinine Clearance: 73.9 mL/min (by C-G formula based on SCr of 0.8 mg/dL).   Medical History: Past Medical History:  Diagnosis Date  . Cancer ([place])   . Dyspnea    WIth activity  . Hilar adenopathy 01/25/2018  . Lung mass 01/25/2018  . Mediastinal adenopathy  01/25/2018  . Tobacco abuse     Medications:   (Not in a hospital admission) Scheduled:  . iopamidol      . sodium chloride       PRN:    Assessment: 66 yoF with NSCLC metastatic to liver, adrenals, bone, presents with hemoptysis and intermittent SOB. CTA shows nonocclusive RLL PE without RH strain, as well as fully drowned L lung d/t tumor obstructing L main bronchus   Baseline INR, aPTT: in process  Prior anticoagulation: none, although patient does take supplement containing garlic, ginger, and several other herbals  Significant events:  Today, 02/09/2018:  CBC: wnl  No frank hemoptysis, but did cough up dime-sized amounts of mucus w/ mixed blood - not clear if this is d/t PE or tumor  CrCl: 74 ml/min  Goal of Therapy: Heparin level 0.3-0.7 units/ml Monitor platelets by anticoagulation protocol: Yes  Plan:  Bolus oitted d/t hemoptysis and smaller PE  Heparin 1150 units/hr IV infusion  Check heparin level 6 hrs after start  Daily CBC, daily heparin level once stable  Monitor for signs of bleeding or thrombosis (esp worsening hemoptysis)   Reuel Boom, PharmD, BCPS [phone] 02/09/2018, 12:14 PM

## 2018-02-09 NOTE — Progress Notes (Signed)
The patient was admitted due to hemotpysis. Since her diagnosis when we met, she was also found to have metastatic disease and is considered stage IV. She will be starting chemotherapy and was going to come to be simmed for palliative treatment as an outpatient. She presented though due to hemoptysis. She will come today for simulation in lieu of starting radiotherapy to the left chest. On exam decreased breath sounds are noted from the scapula down on the left, her right lung fields are clear. The hemoptysis only occurred once today. Written consent is obtained and placed in the chart, a copy was provided to the patient.     Rhonda Meyers, PAC

## 2018-02-09 NOTE — ED Triage Notes (Signed)
Pt diagnosed with stage 4 lung cancer, reports this am was first time coughing up blood. Reports darker blood color hast started chemo or radiation.

## 2018-02-09 NOTE — ED Notes (Signed)
ED TO INPATIENT HANDOFF REPORT  Name/Age/Gender Rhonda Meyers 57 y.o. female  Code Status    Code Status Orders  (From admission, onward)         Start     Ordered   02/09/18 1235  Full code  Continuous     02/09/18 1236        Code Status History    This patient has a current code status but no historical code status.      Home/SNF/Other Home  Chief Complaint Coughing up blood  Level of Care/Admitting Diagnosis ED Disposition    ED Disposition Condition Comment   Admit  Hospital Area: [facility] [[id number]]  Level of Care: Med-Surg [16]  Diagnosis: Pulmonary embolism [facility]) [[id number]]  Admitting Physician: Lavina Hamman [[id number]]  Attending Physician: Lavina Hamman [[id number]]  PT Class (Do Not Modify): Observation [104]  PT Acc Code (Do Not Modify): Observation [10022]       Medical History Past Medical History:  Diagnosis Date  . Cancer ([place])   . Dyspnea    WIth activity  . Hilar adenopathy 01/25/2018  . Lung mass 01/25/2018  . Mediastinal adenopathy  01/25/2018  . Tobacco abuse     Allergies No Known Allergies  IV Location/Drains/Wounds Patient Lines/Drains/Airways Status   Active Line/Drains/Airways    Name:   Placement date:   Placement time:   Site:   Days:   Peripheral IV 02/09/18 Right Antecubital   02/09/18    1006    Antecubital   less than 1          Labs/Imaging Results for orders placed or performed during the hospital encounter of 02/09/18 (from the past 48 hour(s))  CBC with Differential     Status: Abnormal   Collection Time: 02/09/18 10:05 AM  Result Value Ref Range   WBC 12.5 (H) 4.0 - 10.5 K/uL   RBC 4.95 3.87 - 5.11 MIL/uL   Hemoglobin 14.1 12.0 - 15.0 g/dL   HCT 43.0 36.0 - 46.0 %   MCV 86.9 78.0 - 100.0 fL   MCH 28.5 26.0 - 34.0 pg   MCHC 32.8 30.0 - 36.0 g/dL   RDW 14.8 11.5 - 15.5 %   Platelets 249 150 - 400 K/uL   Neutrophils Relative % 78 %   Neutro Abs 9.7 (H) 1.7 - 7.7 K/uL    Lymphocytes Relative 13 %   Lymphs Abs 1.6 0.7 - 4.0 K/uL   Monocytes Relative 8 %   Monocytes Absolute 1.0 0.1 - 1.0 K/uL   Eosinophils Relative 1 %   Eosinophils Absolute 0.2 0.0 - 0.7 K/uL   Basophils Relative 0 %   Basophils Absolute 0.0 0.0 - 0.1 K/uL    Comment: Performed at [facility], [place] [address], [place], [place] [address]  Comprehensive metabolic panel     Status: Abnormal   Collection Time: 02/09/18 10:05 AM  Result Value Ref Range   Sodium 139 135 - 145 mmol/L   Potassium 4.0 3.5 - 5.1 mmol/L   Chloride 103 98 - 111 mmol/L   CO2 28 22 - 32 mmol/L   Glucose, Bld 91 70 - 99 mg/dL   BUN 11 6 - 20 mg/dL   Creatinine, Ser 0.80 0.44 - 1.00 mg/dL   Calcium 9.2 8.9 - 10.3 mg/dL   Total Protein 7.1 6.5 - 8.1 g/dL   Albumin 3.2 (L) 3.5 - 5.0 g/dL   AST 11 (L) 15 - 41 U/L  ALT 11 0 - 44 U/L   Alkaline Phosphatase 82 38 - 126 U/L   Total Bilirubin 0.7 0.3 - 1.2 mg/dL   GFR calc non Af Amer >60 >60 mL/min   GFR calc Af Amer >60 >60 mL/min    Comment: (NOTE) The eGFR has been calculated using the CKD EPI equation. This calculation has not been validated in all clinical situations. eGFR's persistently <60 mL/min signify possible Chronic Kidney Disease.    Anion gap 8 5 - 15    Comment: Performed at [facility], [place] [address], [place], [place] [address]  Brain natriuretic peptide     Status: None   Collection Time: 02/09/18 10:05 AM  Result Value Ref Range   B Natriuretic Peptide 29.4 0.0 - 100.0 pg/mL    Comment: Performed at [facility], [place] [address] [place] [address], [place], [place] [address]  Troponin I     Status: None   Collection Time: 02/09/18 10:05 AM  Result Value Ref Range   Troponin I <0.03 <0.03 ng/mL    Comment: Performed at [facility], [place] [address], [place], [place] [address]  Protime-INR     Status: None   Collection Time: 02/09/18 12:20 PM  Result Value Ref Range   Prothrombin Time  13.1 11.4 - 15.2 seconds   INR 1.00     Comment: Performed at [facility], [place] [address], [place], [place] [address]  APTT     Status: None   Collection Time: 02/09/18 12:20 PM  Result Value Ref Range   aPTT 27 24 - 36 seconds    Comment: Performed at [facility], [place] [address] [place], [place] [address]   Dg Chest 2 View  Result Date: 02/09/2018 CLINICAL DATA:  Stage IV lung the ligament C. New onset hemoptysis this morning associated with shortness of breath and posterior chest pain. Former smoker. EXAM: CHEST - 2 VIEW COMPARISON:  CT scan of the chest of February 04, 2018 and PA and lateral chest x-ray of January 17, 2018 FINDINGS: Opacification of the left hemithorax is nearly total similar to that seen on the recent CT scan. There is some shift of the mediastinum toward the left. The right lung is well-expanded and clear. The observed bony thorax is unremarkable. IMPRESSION: Near total atelectasis of the left lung with large left pleural effusion consistent with a central obstructing lesion. And mild shift of the mediastinum from right to left. Clear right lung. Electronically Signed   By: David  Martinique M.D.   On: 02/09/2018 10:57   Ct Angio Chest Pe W And/or Wo Contrast  Result Date: 02/09/2018 CLINICAL DATA:  Hemoptysis, shortness of breath EXAM: CT ANGIOGRAPHY CHEST WITH CONTRAST TECHNIQUE: Multidetector CT imaging of the chest was performed using the standard protocol during bolus administration of intravenous contrast. Multiplanar CT image reconstructions and MIPs were obtained to evaluate the vascular anatomy. CONTRAST:  142m ISOVUE-370 IOPAMIDOL (ISOVUE-370) INJECTION 76% COMPARISON:  Chest x-ray 02/09/2018. Chest CT 01/17/2018. PET CT 02/04/2018. FINDINGS: Cardiovascular: Heart is normal size. Visualized aorta normal caliber. Filling defects are noted within right lower lobe pulmonary arterial branches compatible with nonocclusive pulmonary  embolus. No evidence of right heart strain Mediastinum/Nodes: Bulky right AP window nodal mass again noted, measuring 4.2 cm compared to 3.6 cm on prior PET CT. Other enlarged mediastinal lymph nodes in the right paratracheal region, subcarinal region and prevascular regions. Lungs/Pleura: Complete drowned lung on the left again noted, stable since prior  PET CT due to occlusion of the left mainstem bronchus related to tumor and adenopathy. Moderate left pleural effusion. Right lung is clear. Upper Abdomen: Right adrenal nodule noted, stable. Musculoskeletal: Chest wall soft tissues are unremarkable. Destructive lytic lesion noted in the left 12th medial rib at the costovertebral junction on image 119 of series 7. Central lucent lesion noted in the the T1 vertebral body compatible with metastasis. Review of the MIP images confirms the above findings. IMPRESSION: Drowned lung on the left due to obstructing tumor and adenopathy obstructing the left mainstem bronchus. Moderate left pleural effusion. Right lung clear. Nonocclusive pulmonary emboli noted in the right lower lobe pulmonary artery. No evidence of right heart strain. Bony metastatic disease involving the medial left 12th rib and the T1 vertebral body. These results were called by telephone at the time of interpretation on 02/09/2018 at 11:32 am to Dr. Gareth Morgan , who verbally acknowledged these results. Electronically Signed   By: Rolm Baptise M.D.   On: 02/09/2018 11:33    Pending Labs Unresulted Labs (From admission, onward)    Start     Ordered   02/10/18 0500  CBC  Daily,   R     02/09/18 1212   02/10/18 0500  Comprehensive metabolic panel  Tomorrow morning,   R     02/09/18 1236   02/10/18 0500  CBC  Tomorrow morning,   R     02/09/18 1236   02/10/18 0500  HIV antibody (Routine Testing)  Tomorrow morning,   R     02/09/18 1236   02/09/18 1900  Heparin level (unfractionated)  Once-Timed,   R     02/09/18 1212           Vitals/Pain Today's Vitals   02/09/18 0917 02/09/18 1008 02/09/18 1100 02/09/18 1225  BP: 105/84 105/84 107/80 (!) 125/93  Pulse: 94 85 85 87  Resp: _0 Temp: 98.3 F (36.8 C)     TempSrc: Oral     SpO2: 100% 99% 99% 100%  PainSc:        Isolation Precautions No active isolations  Medications Medications  sodium chloride 0.9 % injection (has no administration in time range)  iopamidol (ISOVUE-370) 76 % injection (has no administration in time range)  heparin ADULT infusion 100 units/mL (25000 units/290m sodium chloride 0.45%) (1,150 Units/hr Intravenous Transfusing/Transfer 02/09/18 1252)  albuterol (PROVENTIL HFA;VENTOLIN HFA) 108 (90 Base) MCG/ACT inhaler 1-2 puff (has no administration in time range)  albuterol (PROVENTIL) (2.5 MG/3ML) 0.083% nebulizer solution 2.5 mg (has no administration in time range)  guaiFENesin-dextromethorphan (ROBITUSSIN DM) 100-10 MG/5ML syrup 15 mL (has no administration in time range)  oxyCODONE-acetaminophen (PERCOCET/ROXICET) 5-325 MG per tablet 1-2 tablet (has no administration in time range)  acetaminophen (TYLENOL) tablet 650 mg (has no administration in time range)    Or  acetaminophen (TYLENOL) suppository 650 mg (has no administration in time range)  polyethylene glycol (MIRALAX / GLYCOLAX) packet 17 g (has no administration in time range)  ondansetron (ZOFRAN) tablet 4 mg (has no administration in time range)    Or  ondansetron (ZOFRAN) injection 4 mg (has no administration in time range)  senna-docusate (Senokot-S) tablet 1 tablet (has no administration in time range)  benzonatate (TESSALON) capsule 100 mg (has no administration in time range)  iopamidol (ISOVUE-370) 76 % injection 100 mL (100 mLs Intravenous Contrast Given 02/09/18 1110)    Mobility walks

## 2018-02-09 NOTE — Progress Notes (Signed)
Brief Pharmacy Note:   56 y/oF on IV heparin infusion for PE.    First heparin level at 1904 = 0.19 units/mL, subtherapeutic on heparin infusion at 1150 units/hr   CBC: Hgb and Pltc WNL  No infusion issues per nursing  Per RN, patient still having some pink tinged sputum    Plan:  Increase heparin infusion to 1350 units/hr  Heparin level 6 hours after rate change  Daily CBC and heparin level  Monitor closely for s/sx of bleeding   Lindell Spar, PharmD, BCPS Pager: [phone] 02/09/2018 8:08 PM

## 2018-02-09 NOTE — H&P (Signed)
[facility] History and Physical   Patient: Rhonda Meyers XHB:[id number]   PCP: Patient, No Pcp Per DOB: [birth year]   DOA: 02/09/2018   DOS: 02/09/2018   DOS: the patient was seen and examined on 02/09/2018  Patient coming from: The patient is coming from home.  Chief Complaint: Blood in the sputum  HPI: Rhonda Meyers is a 57 y.o. female with Past medical history of SLE diagnosed adenocarcinoma of the lung, former smoker recently quit. Patient presented with complaints of blood in the sputum.  She has had some pink-tinged sputum over last few days but this morning she had frank blood with clot. She denies having any complaints of new chest pain but she has this chronic chest pain for which she presented to the hospital and was diagnosed with malignancy with metastasis which is uncontrolled. She mentions that she takes Norco and it does not help. She denies any nausea or vomiting. No leg cramps. No diarrhea mild constipation.  No blood in the stool. Her appetite is also less. No fever no chills reported by the patient.  ED Course: Presents with complaints of worsening chest pain, dyspnea as well as hemoptysis.  CT of the chest positive for pulmonary embolism.  Started on IV heparin due to history of hemoptysis.  At her baseline ambulates with support And is independent for most of her ADL; manages her medication on her own.  Review of Systems: as mentioned in the history of present illness.  All other systems reviewed and are negative.  Past Medical History:  Diagnosis Date  . Cancer ([place])   . Dyspnea    WIth activity  . Hilar adenopathy 01/25/2018  . Lung mass 01/25/2018  . Mediastinal adenopathy  01/25/2018  . Tobacco abuse    Past Surgical History:  Procedure Laterality Date  . NO PAST SURGERIES    . VIDEO BRONCHOSCOPY WITH ENDOBRONCHIAL ULTRASOUND N/A 01/28/2018   Procedure: VIDEO BRONCHOSCOPY WITH ENDOBRONCHIAL ULTRASOUND;  Surgeon: Garner Nash, DO;   Location: [facility] [facility];  Service: Thoracic;  Laterality: N/A;   Social History:  reports that she quit smoking about 3 weeks ago. Her smoking use included cigarettes. She started smoking about 36 years ago. She has a 15.00 pack-year smoking history. She has never used smokeless tobacco. She reports that she drank alcohol. She reports that she does not use drugs.  No Known Allergies  Family History  Problem Relation Age of Onset  . Cancer Maternal Grandmother 51       breast  . Rheumatic fever Mother   . CAD Mother 29       MI  . Hypertension Sister   . Diabetes Neg Hx   . Stroke Neg Hx      Prior to Admission medications   Medication Sig Start Date End Date Taking? Authorizing Provider  albuterol (PROVENTIL HFA;VENTOLIN HFA) 108 (90 Base) MCG/ACT inhaler Inhale 1-2 puffs into the lungs every 6 (six) hours as needed for wheezing or shortness of breath.   Yes [provider]  albuterol (PROVENTIL) (2.5 MG/3ML) 0.083% nebulizer solution Take 3 mLs (2.5 mg total) by nebulization every 6 (six) hours as needed for wheezing or shortness of breath. 01/28/18  Yes Icard, Bradley L, DO  guaiFENesin-dextromethorphan (ROBITUSSIN DM) 100-10 MG/5ML syrup Take 15 mLs by mouth every 4 (four) hours as needed for cough.   Yes [provider]  HYDROcodone-acetaminophen (NORCO) 5-325 MG tablet Take 1 tablet by mouth every 6 (six) hours as needed for moderate pain. 01/26/18  Yes Icard, Bradley L, DO  naproxen sodium (ALEVE) 220 MG tablet Take 220-440 mg by mouth 2 (two) times daily as needed (pain).   Yes [provider]  OVER THE COUNTER MEDICATION Take 1 capsule by mouth daily. Supplement containing "black seed", garlic, olive leaf, ginger, cayenne.   Yes [provider]  Zinc 50 MG CAPS Take 50 mg by mouth daily.   Yes [provider]    Physical Exam: Vitals:   02/09/18 1100 02/09/18 1225 02/09/18 1300 02/09/18 1358  BP: 107/80 (!) 125/93 115/88 117/82  Pulse: 85  87 84 83  Resp: 19 20 (!) 21 (!) 22  Temp:    98.2 F (36.8 C)  TempSrc:    Oral  SpO2: 99% 100% 99% 97%    General: Alert, Awake and Oriented to Time, Place and Person. Appear in mild distress, affect appropriate Eyes: PERRL, Conjunctiva normal ENT: Oral Mucosa clear moist. Neck: no JVD, no Abnormal Mass Or lumps Cardiovascular: S1 and S2 Present, no Murmur, Peripheral Pulses Present Respiratory: normal respiratory effort, Bilateral Air entry equal and Decreased, no use of accessory muscle, left Crackles, no wheezes Abdomen: Bowel Sound present, Soft and no tenderness, no hernia Skin: no redness, no Rash, no induration Extremities: trace Pedal edema, no calf tenderness Neurologic: Grossly no focal neuro deficit. Bilaterally Equal motor strength  Labs on Admission:  CBC: Recent Labs  Lab 02/05/18 1214 02/09/18 1005  WBC 11.4* 12.5*  NEUTROABS 8.7* 9.7*  HGB 13.8 14.1  HCT 43.0 43.0  MCV 85.2 86.9  PLT 252 725   Basic Metabolic Panel: Recent Labs  Lab 02/05/18 1214 02/09/18 1005  NA 144 139  K 3.4* 4.0  CL 105 103  CO2 31 28  GLUCOSE 95 91  BUN 11 11  CREATININE 0.78 0.80  CALCIUM 9.1 9.2   GFR: Estimated Creatinine Clearance: 73.9 mL/min (by C-G formula based on SCr of 0.8 mg/dL). Liver Function Tests: Recent Labs  Lab 02/05/18 1214 02/09/18 1005  AST 9* 11*  ALT 10 11  ALKPHOS 104 82  BILITOT 0.4 0.7  PROT 6.9 7.1  ALBUMIN 3.0* 3.2*   No results for input(s): LIPASE, AMYLASE in the last 168 hours. No results for input(s): AMMONIA in the last 168 hours. Coagulation Profile: Recent Labs  Lab 02/09/18 1220  INR 1.00   Cardiac Enzymes: Recent Labs  Lab 02/09/18 1005  TROPONINI <0.03   BNP (last 3 results) No results for input(s): PROBNP in the last 8760 hours. HbA1C: No results for input(s): HGBA1C in the last 72 hours. CBG: Recent Labs  Lab 02/04/18 1440  GLUCAP 88   Lipid Profile: No results for input(s): CHOL, HDL, LDLCALC, TRIG,  CHOLHDL, LDLDIRECT in the last 72 hours. Thyroid Function Tests: No results for input(s): TSH, T4TOTAL, FREET4, T3FREE, THYROIDAB in the last 72 hours. Anemia Panel: No results for input(s): VITAMINB12, FOLATE, FERRITIN, TIBC, IRON, RETICCTPCT in the last 72 hours. Urine analysis:    Component Value Date/Time   BILIRUBINUR neg 10/22/2017 0855   PROTEINUR Negative 10/22/2017 0855   UROBILINOGEN 0.2 10/22/2017 0855   NITRITE neg 10/22/2017 0855   LEUKOCYTESUR Small (1+) (A) 10/22/2017 0855    Radiological Exams on Admission: Dg Chest 2 View  Result Date: 02/09/2018 CLINICAL DATA:  Stage IV lung the ligament C. New onset hemoptysis this morning associated with shortness of breath and posterior chest pain. Former smoker. EXAM: CHEST - 2 VIEW COMPARISON:  CT scan of the chest of February 04, 2018  and PA and lateral chest x-ray of January 17, 2018 FINDINGS: Opacification of the left hemithorax is nearly total similar to that seen on the recent CT scan. There is some shift of the mediastinum toward the left. The right lung is well-expanded and clear. The observed bony thorax is unremarkable. IMPRESSION: Near total atelectasis of the left lung with large left pleural effusion consistent with a central obstructing lesion. And mild shift of the mediastinum from right to left. Clear right lung. Electronically Signed   By: David  Martinique M.D.   On: 02/09/2018 10:57   Ct Angio Chest Pe W And/or Wo Contrast  Result Date: 02/09/2018 CLINICAL DATA:  Hemoptysis, shortness of breath EXAM: CT ANGIOGRAPHY CHEST WITH CONTRAST TECHNIQUE: Multidetector CT imaging of the chest was performed using the standard protocol during bolus administration of intravenous contrast. Multiplanar CT image reconstructions and MIPs were obtained to evaluate the vascular anatomy. CONTRAST:  153mL ISOVUE-370 IOPAMIDOL (ISOVUE-370) INJECTION 76% COMPARISON:  Chest x-ray 02/09/2018. Chest CT 01/17/2018. PET CT 02/04/2018. FINDINGS:  Cardiovascular: Heart is normal size. Visualized aorta normal caliber. Filling defects are noted within right lower lobe pulmonary arterial branches compatible with nonocclusive pulmonary embolus. No evidence of right heart strain Mediastinum/Nodes: Bulky right AP window nodal mass again noted, measuring 4.2 cm compared to 3.6 cm on prior PET CT. Other enlarged mediastinal lymph nodes in the right paratracheal region, subcarinal region and prevascular regions. Lungs/Pleura: Complete drowned lung on the left again noted, stable since prior PET CT due to occlusion of the left mainstem bronchus related to tumor and adenopathy. Moderate left pleural effusion. Right lung is clear. Upper Abdomen: Right adrenal nodule noted, stable. Musculoskeletal: Chest wall soft tissues are unremarkable. Destructive lytic lesion noted in the left 12th medial rib at the costovertebral junction on image 119 of series 7. Central lucent lesion noted in the the T1 vertebral body compatible with metastasis. Review of the MIP images confirms the above findings. IMPRESSION: Drowned lung on the left due to obstructing tumor and adenopathy obstructing the left mainstem bronchus. Moderate left pleural effusion. Right lung clear. Nonocclusive pulmonary emboli noted in the right lower lobe pulmonary artery. No evidence of right heart strain. Bony metastatic disease involving the medial left 12th rib and the T1 vertebral body. These results were called by telephone at the time of interpretation on 02/09/2018 at 11:32 am to Dr. Gareth Morgan , who verbally acknowledged these results. Electronically Signed   By: Rolm Baptise M.D.   On: 02/09/2018 11:33   EKG: Independently reviewed. normal sinus rhythm, nonspecific ST and T waves changes.  Assessment/Plan 1. Pulmonary embolism ([place]) Presents with hemoptysis. CT PE protocol positive for small nonobstructive PE on the right side. I suspect that her hemoptysis primarily coming from her cancer and  PE is an incidental finding. Patient is not hypoxic not hypotensive not tachycardic. Due to history of hemoptysis patient started on IV heparin.  Monitor overnight. Patient was given information regarding Lovenox, warfarin, NOAC. Currently patient's preference is NOAC.  She definitely does not want Lovenox injections. Case manager consult for further assessment of availability of these medications. Recommending lifelong anticoagulation as long as the patient does not have any significant bleeding episode due to her history of metastatic cancer.  2.  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 Cancer related pain. Recently diagnosed, seen by oncology for the first time recently. Was not able by radiology but they were able to see the patient here in the hospital. Family  had concerns about pain control and I informed that for now best option is to control the pain with pain medications. Patient was on naproxen which I have to hold while the patient is on IV heparin for now. Changing Norco to Percocet. Bowel regimen also added for constipation. Radiation oncology will initiate simulation prior to discharge.  3.  Active smoker. Recently quit. Monitor.  Nutrition: Regular diet  Advance goals of care discussion: full code   Consults: none  Family Communication: family was present at bedside, at the time of interview.  Opportunity was given to ask question and all questions were answered satisfactorily.  Disposition: Admitted as observation, med-surge unit. Likely to be discharged home, in 1 days.  Severity of Illness: The appropriate patient status for this patient is OBSERVATION. Observation status is judged to be reasonable and necessary in order to provide the required intensity of service to ensure the patient's safety. The patient's presenting symptoms, physical exam findings, and initial radiographic and laboratory data in the context of their medical  condition is felt to place them at decreased risk for further clinical deterioration. Furthermore, it is anticipated that the patient will be medically stable for discharge from the hospital within 2 midnights of admission. The following factors support the patient status of observation.   " The patient's presenting symptoms include hemoptysis. " The physical exam findings include left-sided crackles. " The initial radiographic and laboratory data are pulmonary embolism on CT scan of the chest.     Author: Berle Mull, MD [facility] 02/09/2018  If 7PM-7AM, please contact night-coverage [email or web address]

## 2018-02-10 ENCOUNTER — Other Ambulatory Visit: Payer: Self-pay | Admitting: Internal Medicine

## 2018-02-10 ENCOUNTER — Ambulatory Visit
Admit: 2018-02-10 | Discharge: 2018-02-10 | Disposition: A | Discharge status: Home / Self Care | Payer: 59 | Attending: Radiation Oncology | Admitting: Radiation Oncology

## 2018-02-10 DIAGNOSIS — Z87891 Personal history of nicotine dependence: Secondary | ICD-10-CM | POA: Diagnosis not present

## 2018-02-10 DIAGNOSIS — Z79899 Other long term (current) drug therapy: Secondary | ICD-10-CM | POA: Diagnosis not present

## 2018-02-10 DIAGNOSIS — C7951 Secondary malignant neoplasm of bone: Secondary | ICD-10-CM | POA: Diagnosis not present

## 2018-02-10 DIAGNOSIS — G893 Neoplasm related pain (acute) (chronic): Secondary | ICD-10-CM | POA: Diagnosis not present

## 2018-02-10 DIAGNOSIS — C3492 Malignant neoplasm of unspecified part of left bronchus or lung: Secondary | ICD-10-CM | POA: Diagnosis not present

## 2018-02-10 DIAGNOSIS — C3402 Malignant neoplasm of left main bronchus: Secondary | ICD-10-CM | POA: Diagnosis not present

## 2018-02-10 DIAGNOSIS — C787 Secondary malignant neoplasm of liver and intrahepatic bile duct: Secondary | ICD-10-CM | POA: Diagnosis not present

## 2018-02-10 DIAGNOSIS — Z510 Encounter for antineoplastic radiation therapy: Secondary | ICD-10-CM | POA: Diagnosis not present

## 2018-02-10 DIAGNOSIS — C7972 Secondary malignant neoplasm of left adrenal gland: Secondary | ICD-10-CM | POA: Diagnosis not present

## 2018-02-10 DIAGNOSIS — I2694 Multiple subsegmental pulmonary emboli without acute cor pulmonale: Secondary | ICD-10-CM | POA: Diagnosis not present

## 2018-02-10 DIAGNOSIS — C771 Secondary and unspecified malignant neoplasm of intrathoracic lymph nodes: Secondary | ICD-10-CM | POA: Diagnosis not present

## 2018-02-10 LAB — CBC
HCT: 41.2 % (ref 36.0–46.0)
HEMOGLOBIN: 13.5 g/dL (ref 12.0–15.0)
MCH: 27.8 pg (ref 26.0–34.0)
MCHC: 32.8 g/dL (ref 30.0–36.0)
MCV: 84.8 fL (ref 80.0–100.0)
PLATELETS: 235 10*3/uL (ref 150–400)
RBC: 4.86 MIL/uL (ref 3.87–5.11)
RDW: 14.7 % (ref 11.5–15.5)
WBC: 11.6 10*3/uL — ABNORMAL HIGH (ref 4.0–10.5)

## 2018-02-10 LAB — COMPREHENSIVE METABOLIC PANEL
ALK PHOS: 79 U/L (ref 38–126)
ALT: 11 U/L (ref 0–44)
AST: 11 U/L — ABNORMAL LOW (ref 15–41)
Albumin: 3.1 g/dL — ABNORMAL LOW (ref 3.5–5.0)
Anion gap: 12 (ref 5–15)
BUN: 13 mg/dL (ref 6–20)
CALCIUM: 9 mg/dL (ref 8.9–10.3)
CO2: 26 mmol/L (ref 22–32)
CREATININE: 0.8 mg/dL (ref 0.44–1.00)
Chloride: 102 mmol/L (ref 98–111)
Glucose, Bld: 95 mg/dL (ref 70–99)
Potassium: 4.1 mmol/L (ref 3.5–5.1)
SODIUM: 140 mmol/L (ref 135–145)
TOTAL PROTEIN: 6.7 g/dL (ref 6.5–8.1)
Total Bilirubin: 0.6 mg/dL (ref 0.3–1.2)

## 2018-02-10 LAB — HEPARIN LEVEL (UNFRACTIONATED): HEPARIN UNFRACTIONATED: 0.42 [IU]/mL (ref 0.30–0.70)

## 2018-02-10 MED ORDER — APIXABAN 5 MG PO TABS: 10.0000 mg | ORAL_TABLET | Freq: Two times a day (BID) | ORAL | Status: DC

## 2018-02-10 MED ORDER — ELIQUIS 5 MG VTE STARTER PACK: ORAL_TABLET | ORAL | 0 refills | Status: AC

## 2018-02-10 MED ORDER — OXYCODONE-ACETAMINOPHEN 5-325 MG PO TABS: 1.0000 | ORAL_TABLET | Freq: Four times a day (QID) | ORAL | 0 refills | Status: DC | PRN

## 2018-02-10 MED ORDER — APIXABAN 5 MG PO TABS
10.0000 mg | ORAL_TABLET | ORAL | Status: AC
  Administered 2018-02-10: 10 mg via ORAL
  Filled 2018-02-10: qty 2

## 2018-02-10 MED ORDER — SENNOSIDES-DOCUSATE SODIUM 8.6-50 MG PO TABS: 1.0000 | ORAL_TABLET | Freq: Two times a day (BID) | ORAL | 0 refills | Status: DC

## 2018-02-10 MED ORDER — BENZONATATE 100 MG PO CAPS: 100.0000 mg | ORAL_CAPSULE | Freq: Three times a day (TID) | ORAL | 0 refills | Status: DC

## 2018-02-10 MED ORDER — APIXABAN 5 MG PO TABS: 5.0000 mg | ORAL_TABLET | Freq: Two times a day (BID) | ORAL | Status: DC

## 2018-02-10 MED ORDER — POLYETHYLENE GLYCOL 3350 17 G PO PACK: 17.0000 g | PACK | Freq: Every day | ORAL | 0 refills | Status: DC

## 2018-02-10 NOTE — Progress Notes (Signed)
Patient has discharged to home on 02/10/18. Patient is educated on new blood thinner medication by pharmacist. Patient does not have any question at this time. Discharge instruction including medication and appointment was given to patient and family member at bedside.

## 2018-02-10 NOTE — Progress Notes (Signed)
ANTICOAGULATION CONSULT NOTE - Follow Up Consult  Pharmacy Consult for heparin --> Eliquis Indication: new pulmonary embolus  No Known Allergies  Patient Measurements: weight 79 kg, height 61 inches   Heparin Dosing Weight: 66 kg  Vital Signs: Temp: 98.2 F (36.8 C) (10/07 2031) Temp Source: Oral (10/07 2031) BP: 109/75 (10/07 2031) Pulse Rate: 80 (10/07 2031)  Labs: Recent Labs    02/09/18 1005 02/09/18 1220 02/09/18 1904 02/10/18 0230  HGB 14.1  --   --  13.5  HCT 43.0  --   --  41.2  PLT 249  --   --  235  APTT  --  27  --   --   LABPROT  --  13.1  --   --   INR  --  1.00  --   --   HEPARINUNFRC  --   --  0.19* 0.42  CREATININE 0.80  --   --  0.80  TROPONINI <0.03  --   --   --     Estimated Creatinine Clearance: 73.9 mL/min (by C-G formula based on SCr of 0.8 mg/dL).   Assessment: Patient is a 57 y.o F with NSCL metastatic to liver, adrenals, bone, presents with hemoptysis and intermittent SOB.  Chest CTA on 10/8 showed "Nonocclusive pulmonary emboli noted in the right lower lobe pulmonary artery. No evidence of right heart strain."  Heparin started on admission for PE.  Per Dr. Lonny Prude, to transition patient to Eliquis on 10/8.  Today, 02/10/2018: - CBC stable - still with hemoptysis  Goal of Therapy:  Heparin level 0.3-0.7 units/ml Monitor platelets by anticoagulation protocol: Yes   Plan:  - d/c heparin drip - start Eliquis 10 mg BID x7 days, then 5 mg PO BID - monitor for s/s bleeding  Dany Harten P 02/10/2018,7:08 AM

## 2018-02-10 NOTE — Progress Notes (Signed)
Patient Saturations on Room Air at Rest =  96 %  Patient Saturations on Room Air while Ambulating = 92 %

## 2018-02-10 NOTE — Discharge Instructions (Addendum)
Rhonda Meyers,  You were in the hospital because you were found to have a clot in your lung. You have been started on blood thinners. You also have some blood with coughing. This may be related to the clot or your cancer. Your blood count has been stable. If your bloody cough worsens, or if you start having worsening chest pain, trouble breathing, lightheadedness, palpitation, please call your physician immediately or go to the emergency department.   Information on my medicine - ELIQUIS (apixaban)  This medication education was reviewed with me or my healthcare representative as part of my discharge preparation.    Why was Eliquis prescribed for you? Eliquis was prescribed to treat blood clots that may have been found in the veins of your legs (deep vein thrombosis) or in your lungs (pulmonary embolism) and to reduce the risk of them occurring again.  What do You need to know about Eliquis ? The starting dose is 10 mg (two 5 mg tablets) taken TWICE daily for the FIRST SEVEN (7) DAYS, then on 02/17/18  the dose is reduced to ONE 5 mg tablet taken TWICE daily.  Eliquis may be taken with or without food.   Try to take the dose about the same time in the morning and in the evening. If you have difficulty swallowing the tablet whole please discuss with your pharmacist how to take the medication safely.  Take Eliquis exactly as prescribed and DO NOT stop taking Eliquis without talking to the doctor who prescribed the medication.  Stopping may increase your risk of developing a new blood clot.  Refill your prescription before you run out.  After discharge, you should have regular check-up appointments with your healthcare provider that is prescribing your Eliquis.    What do you do if you miss a dose? If a dose of ELIQUIS is not taken at the scheduled time, take it as soon as possible on the same day and twice-daily administration should be resumed. The dose should not be doubled to  make up for a missed dose.  Important Safety Information A possible side effect of Eliquis is bleeding. You should call your healthcare provider right away if you experience any of the following: ? Bleeding from an injury or your nose that does not stop. ? Unusual colored urine (red or dark brown) or unusual colored stools (red or black). ? Unusual bruising for unknown reasons. ? A serious fall or if you hit your head (even if there is no bleeding).  Some medicines may interact with Eliquis and might increase your risk of bleeding or clotting while on Eliquis. To help avoid this, consult your healthcare provider or pharmacist prior to using any new prescription or non-prescription medications, including herbals, vitamins, non-steroidal anti-inflammatory drugs (NSAIDs) and supplements.  This website has more information on Eliquis (apixaban): http://[email or web address].com/eliquis/home

## 2018-02-10 NOTE — Progress Notes (Signed)
ANTICOAGULATION CONSULT NOTE - Follow Up Consult  Pharmacy Consult for Heparin Indication: pulmonary embolus  No Known Allergies  Patient Measurements:   Heparin Dosing Weight:   Vital Signs: Temp: 98.2 F (36.8 C) (10/07 2031) Temp Source: Oral (10/07 2031) BP: 109/75 (10/07 2031) Pulse Rate: 80 (10/07 2031)  Labs: Recent Labs    02/09/18 1005 02/09/18 1220 02/09/18 1904 02/10/18 0230  HGB 14.1  --   --  13.5  HCT 43.0  --   --  41.2  PLT 249  --   --  235  APTT  --  27  --   --   LABPROT  --  13.1  --   --   INR  --  1.00  --   --   HEPARINUNFRC  --   --  0.19* 0.42  CREATININE 0.80  --   --  0.80  TROPONINI <0.03  --   --   --     Estimated Creatinine Clearance: 73.9 mL/min (by C-G formula based on SCr of 0.8 mg/dL).   Medications:  Infusions:  . heparin 1,350 Units/hr (02/10/18 0300)    Assessment: Patient with heparin level at goal.  No heparin issues noted.  Goal of Therapy:  Heparin level 0.3-0.7 units/ml Monitor platelets by anticoagulation protocol: Yes   Plan:  Continue heparin drip at current rate Recheck level at [place], [place] 02/10/2018,4:36 AM

## 2018-02-10 NOTE — Discharge Summary (Signed)
Physician Discharge Summary  Rhonda Meyers AVW:[id number] DOB: [birth year] DOA: 02/09/2018  PCP: Patient, No Pcp Per  Admit date: 02/09/2018 Discharge date: 02/10/2018  Admitted From: Home Disposition: Home  Recommendations for Outpatient Follow-up:  1. Follow up with medical oncology within the week 2. Please follow up on the following pending results: None  Home Health: None Equipment/Devices: None  Discharge Condition: Stable CODE STATUS: Full code Diet recommendation: Regular diet   Brief/Interim Summary:  Admission HPI written by Lavina Hamman, MD   Chief Complaint: Blood in the sputum  HPI: Rhonda Meyers is a 57 y.o. female with Past medical history of SLE diagnosed adenocarcinoma of the lung, former smoker recently quit. Patient presented with complaints of blood in the sputum.  She has had some pink-tinged sputum over last few days but this morning she had frank blood with clot. She denies having any complaints of new chest pain but she has this chronic chest pain for which she presented to the hospital and was diagnosed with malignancy with metastasis which is uncontrolled. She mentions that she takes Norco and it does not help. She denies any nausea or vomiting. No leg cramps. No diarrhea mild constipation.  No blood in the stool. Her appetite is also less. No fever no chills reported by the patient.  ED Course: Presents with complaints of worsening chest pain, dyspnea as well as hemoptysis.  CT of the chest positive for pulmonary embolism.  Started on IV heparin due to history of hemoptysis.   Hospital course:  Pulmonary embolism Patient with mild hemoptysis. No frank blood; streaky. Concern for PE vs cancer related. Patient started on heparin IV with stable hemoglobin. Transition to anticoagulation was discussed and patient does not want Lovenox. Patient switched to Eliquis on discharge. Precautions discussed for return if worsening hemoptysis  or symptoms concerning for anemia  Non-small cell lung cancer Stage 4. Patient is following with Dr. Julien Nordmann as an outpatient. Recommend quick follow-up.  Discharge Diagnoses:  Principal Problem:   Pulmonary embolism (HCC) Active Problems:   Tobacco abuse   Adenocarcinoma of left lung, stage 4 (HCC)   Cancer related pain    Discharge Instructions  Discharge Instructions    Call MD for:  difficulty breathing, headache or visual disturbances   Complete by:  As directed    Increase activity slowly   Complete by:  As directed      Allergies as of 02/10/2018   No Known Allergies     Medication List    STOP taking these medications   naproxen sodium 220 MG tablet Commonly known as:  ALEVE     TAKE these medications   albuterol 108 (90 Base) MCG/ACT inhaler Commonly known as:  PROVENTIL HFA;VENTOLIN HFA Inhale 1-2 puffs into the lungs every 6 (six) hours as needed for wheezing or shortness of breath.   albuterol (2.5 MG/3ML) 0.083% nebulizer solution Commonly known as:  PROVENTIL Take 3 mLs (2.5 mg total) by nebulization every 6 (six) hours as needed for wheezing or shortness of breath.   benzonatate 100 MG capsule Commonly known as:  TESSALON Take 1 capsule (100 mg total) by mouth 3 (three) times daily.   ELIQUIS STARTER PACK 5 MG Tabs Take as directed on package: start with two-5mg  tablets twice daily for 7 days. On day 8, switch to one-5mg  tablet twice daily.   guaiFENesin-dextromethorphan 100-10 MG/5ML syrup Commonly known as:  ROBITUSSIN DM Take 15 mLs by mouth every 4 (four) hours as needed for cough.  HYDROcodone-acetaminophen 5-325 MG tablet Commonly known as:  NORCO/VICODIN Take 1 tablet by mouth every 6 (six) hours as needed for moderate pain.   OVER THE COUNTER MEDICATION Take 1 capsule by mouth daily. Supplement containing "black seed", garlic, olive leaf, ginger, cayenne.   polyethylene glycol packet Commonly known as:  MIRALAX / GLYCOLAX Take 17  g by mouth daily. Start taking on:  02/11/2018   senna-docusate 8.6-50 MG tablet Commonly known as:  Senokot-S Take 1 tablet by mouth 2 (two) times daily.   Zinc 50 MG Caps Take 50 mg by mouth daily.      Follow-up Information    Curt Bears, MD Follow up.   Specialty:  Oncology Why:  Follow-up Contact information: [place] [id number] [phone]          No Known Allergies  Consultations:  None   Procedures/Studies: Dg Chest 2 View  Result Date: 02/09/2018 CLINICAL DATA:  Stage IV lung the ligament C. New onset hemoptysis this morning associated with shortness of breath and posterior chest pain. Former smoker. EXAM: CHEST - 2 VIEW COMPARISON:  CT scan of the chest of February 04, 2018 and PA and lateral chest x-ray of January 17, 2018 FINDINGS: Opacification of the left hemithorax is nearly total similar to that seen on the recent CT scan. There is some shift of the mediastinum toward the left. The right lung is well-expanded and clear. The observed bony thorax is unremarkable. IMPRESSION: Near total atelectasis of the left lung with large left pleural effusion consistent with a central obstructing lesion. And mild shift of the mediastinum from right to left. Clear right lung. Electronically Signed   By: David  Martinique M.D.   On: 02/09/2018 10:57   Dg Chest 2 View  Result Date: 01/17/2018 CLINICAL DATA:  Cough and chest pain and shortness of breath for 1 month. EXAM: CHEST - 2 VIEW COMPARISON:  Chest x-ray 01/07/2018 FINDINGS: Significant worsening of left lung airspace process when compared the prior study. There is a central appearing "mass ", significant loss of volume in the left hemithorax and diffuse airspace process. I would be very concerned about a central obstructing lesion with obstructive pneumonitis. Recommend chest CT with contrast for further evaluation. The right lung appears clear. IMPRESSION: Significant worsening of left lung  airspace process with findings worrisome for a central mass and obstructive pneumonitis. Recommend CT with contrast for further evaluation. Electronically Signed   By: Marijo Sanes M.D.   On: 01/17/2018 15:09   Ct Angio Chest Pe W And/or Wo Contrast  Result Date: 02/09/2018 CLINICAL DATA:  Hemoptysis, shortness of breath EXAM: CT ANGIOGRAPHY CHEST WITH CONTRAST TECHNIQUE: Multidetector CT imaging of the chest was performed using the standard protocol during bolus administration of intravenous contrast. Multiplanar CT image reconstructions and MIPs were obtained to evaluate the vascular anatomy. CONTRAST:  160mL ISOVUE-370 IOPAMIDOL (ISOVUE-370) INJECTION 76% COMPARISON:  Chest x-ray 02/09/2018. Chest CT 01/17/2018. PET CT 02/04/2018. FINDINGS: Cardiovascular: Heart is normal size. Visualized aorta normal caliber. Filling defects are noted within right lower lobe pulmonary arterial branches compatible with nonocclusive pulmonary embolus. No evidence of right heart strain Mediastinum/Nodes: Bulky right AP window nodal mass again noted, measuring 4.2 cm compared to 3.6 cm on prior PET CT. Other enlarged mediastinal lymph nodes in the right paratracheal region, subcarinal region and prevascular regions. Lungs/Pleura: Complete drowned lung on the left again noted, stable since prior PET CT due to occlusion of the left mainstem bronchus related to tumor  and adenopathy. Moderate left pleural effusion. Right lung is clear. Upper Abdomen: Right adrenal nodule noted, stable. Musculoskeletal: Chest wall soft tissues are unremarkable. Destructive lytic lesion noted in the left 12th medial rib at the costovertebral junction on image 119 of series 7. Central lucent lesion noted in the the T1 vertebral body compatible with metastasis. Review of the MIP images confirms the above findings. IMPRESSION: Drowned lung on the left due to obstructing tumor and adenopathy obstructing the left mainstem bronchus. Moderate left pleural  effusion. Right lung clear. Nonocclusive pulmonary emboli noted in the right lower lobe pulmonary artery. No evidence of right heart strain. Bony metastatic disease involving the medial left 12th rib and the T1 vertebral body. These results were called by telephone at the time of interpretation on 02/09/2018 at 11:32 am to Dr. Gareth Morgan , who verbally acknowledged these results. Electronically Signed   By: Rolm Baptise M.D.   On: 02/09/2018 11:33   Ct Angio Chest Pe W And/or Wo Contrast  Result Date: 01/17/2018 CLINICAL DATA:  Left-sided chest pain and coughing since yesterday. Shortness of breath 1 month. EXAM: CT ANGIOGRAPHY CHEST WITH CONTRAST TECHNIQUE: Multidetector CT imaging of the chest was performed using the standard protocol during bolus administration of intravenous contrast. Multiplanar CT image reconstructions and MIPs were obtained to evaluate the vascular anatomy. CONTRAST:  62mL ISOVUE-370 IOPAMIDOL (ISOVUE-370) INJECTION 76% COMPARISON:  Chest x-ray today. FINDINGS: Cardiovascular: Mild cardiomegaly. Thoracic aorta is otherwise unremarkable. Pulmonary arterial system is within normal without evidence of emboli. Mild compression of the proximal left lower lobar pulmonary artery due to the perihilar mass and mediastinal adenopathy. Mediastinum/Nodes: 1.3 cm prevascular lymph node. Bulky adenopathy over the region of the AP window extending into the left hilum and subcarinal region. 1.2 cm right paratracheal lymph node. Lungs/Pleura: There is volume loss of the left lung with obstruction of the left mainstem bronchus. No air-filled central left bronchi are demonstrated. There is a large ill-defined left perihilar/central lung mass continuous with the bulky adenopathy over the left hilum, subcarinal region and AP window. Elevated left hemidiaphragm. Moderate collapse of the left lower lobe with patchy airspace opacification over the lingula. 1.3 cm nodule over the left upper lobe likely  metastatic. Small amount left pleural fluid. Right lung is clear. Upper Abdomen: Liver is within normal. Mild prominence of the right adrenal gland. Mild calcification along the medial border of the right lobe of the liver. Musculoskeletal: Unremarkable. Review of the MIP images confirms the above findings. IMPRESSION: Large ill-defined central left perihilar mass continuous with bulky left hilar and mediastinal adenopathy as described likely representing a central primary bronchogenic neoplasm. There is complete obstruction of the left mainstem bronchus with volume loss of the left lung. Small amount of left pleural fluid. Discrete 1.3 cm nodule over the left upper lobe likely metastatic. Recommend tissue sampling for further evaluation. No evidence of pulmonary emboli. Mild cardiomegaly. Electronically Signed   By: Marin Olp M.D.   On: 01/17/2018 19:00   Nm Pet Image Initial (pi) Skull Base To Thigh  Result Date: 02/04/2018 CLINICAL DATA:  Initial treatment strategy for left hilar mass and adenopathy. EXAM: NUCLEAR MEDICINE PET SKULL BASE TO THIGH TECHNIQUE: 8.3 mCi F-18 FDG was injected intravenously. Full-ring PET imaging was performed from the skull base to thigh after the radiotracer. CT data was obtained and used for attenuation correction and anatomic localization. Fasting blood glucose: 88 mg/dl COMPARISON:  CT chest 01/17/2018 FINDINGS: Mediastinal blood pool activity: SUV max 2.61 NECK:  There is asymmetric uptake noted in the right arytenoid area. This is likely due to a paralyzed left vocal cord which appears to be bulging in the midline and likely due to recurrent laryngeal nerve involvement as it courses under the aortic arch on the left side. No neck mass or lymphadenopathy. Incidental CT findings: none CHEST: Completely drowned/obstructed left lung due to tumor obstructing the left mainstem bronchus. There are 3 hypermetabolic lesions in the left lung. The largest is at the left lung base and  measures approximately 4 cm. The SUV max is 13.97. Slightly more superiorly in the left lower lobe is a 3 cm nodule has an SUV max of 19.3. The third lesion is in the left upper lobe and measures approximately 19 mm. SUV max is 9.11. 2 cm left hilar node is hypermetabolic with SUV max of 18.2. Large necrotic appearing subcarinal mass measures 3.6 cm and SUV max is 31.45. This also involves the left mainstem bronchus which is filled with tumor. Two metastatic prevascular lymph nodes are also noted and there is contralateral right paratracheal metastatic adenopathy. No definite metastatic pulmonary nodules are noted in the right lung. Incidental CT findings: Small left pleural effusion. ABDOMEN/PELVIS: Single metastatic lesion in the liver is noted centrally near the caudate lobe. This is difficult to identify on the non-contrast CT scan. It measures approximately 16 mm and the SUV max is 8.12. Bilateral adrenal gland metastasis. The right has an SUV max of 9.41 and the left has an SUV max of 6.13. Small lymph node in the gastrohepatic ligament region has an SUV max of 7.06. Incidental CT findings: Large anterior abdominal wall hernia containing fat and vessels. Soft tissue density in the hernia is slightly hypermetabolic and may be due to fat necrosis or prior incarceration. No overt tumor. SKELETON: Osseous metastatic disease is demonstrated. There are lesions involving the left clavicle, both upper scapulae, numerous vertebral bodies, transverse processes, ribs, sternum and pelvis. No spinal canal compromise is demonstrated. Incidental CT findings: none IMPRESSION: 1. Completely drowned/obstructed left lung due to extensive tumor in the left mainstem bronchus with surrounding bulky mediastinal lymphadenopathy. 2. Three left lung lesions along with left hilar, prevascular, AP window, subcarinal and right paratracheal adenopathy. 3. Single hepatic metastatic focus. 4. Bilateral adrenal gland metastasis. 5. Extensive  osseous metastatic disease. Electronically Signed   By: Marijo Sanes M.D.   On: 02/04/2018 16:50      Subjective: No dyspnea or chest pain. Streaky bloody sputum.  Discharge Exam: Vitals:   02/09/18 2031 02/10/18 0750  BP: 109/75 110/80  Pulse: 80 78  Resp: (!) 22 18  Temp: 98.2 F (36.8 C) 98.2 F (36.8 C)  SpO2: 96%    Vitals:   02/09/18 1300 02/09/18 1358 02/09/18 2031 02/10/18 0750  BP: 115/88 117/82 109/75 110/80  Pulse: 84 83 80 78  Resp: (!) 21 (!) 22 (!) 22 18  Temp:  98.2 F (36.8 C) 98.2 F (36.8 C) 98.2 F (36.8 C)  TempSrc:  Oral Oral Oral  SpO2: 99% 97% 96%   Weight:    79 kg  Height:    5' 0.98" (1.549 m)    General: Pt is alert, awake, not in acute distress Cardiovascular: RRR, S1/S2 +, no rubs, no gallops Respiratory: Decreased breath sounds, no wheezing, no rhonchi Abdominal: Soft, NT, ND, bowel sounds + Extremities: no edema, no cyanosis    The results of significant diagnostics from this hospitalization (including imaging, microbiology, ancillary and laboratory) are listed below for  reference.     Microbiology: No results found for this or any previous visit (from the past 240 hour(s)).   Labs: BNP (last 3 results) Recent Labs    02/09/18 1005  BNP 18.2   Basic Metabolic Panel: Recent Labs  Lab 02/05/18 1214 02/09/18 1005 02/10/18 0230  NA 144 139 140  K 3.4* 4.0 4.1  CL 105 103 102  CO2 31 28 26   GLUCOSE 95 91 95  BUN 11 11 13   CREATININE 0.78 0.80 0.80  CALCIUM 9.1 9.2 9.0   Liver Function Tests: Recent Labs  Lab 02/05/18 1214 02/09/18 1005 02/10/18 0230  AST 9* 11* 11*  ALT 10 11 11   ALKPHOS 104 82 79  BILITOT 0.4 0.7 0.6  PROT 6.9 7.1 6.7  ALBUMIN 3.0* 3.2* 3.1*   No results for input(s): LIPASE, AMYLASE in the last 168 hours. No results for input(s): AMMONIA in the last 168 hours. CBC: Recent Labs  Lab 02/05/18 1214 02/09/18 1005 02/10/18 0230  WBC 11.4* 12.5* 11.6*  NEUTROABS 8.7* 9.7*  --   HGB  13.8 14.1 13.5  HCT 43.0 43.0 41.2  MCV 85.2 86.9 84.8  PLT 252 249 235   Cardiac Enzymes: Recent Labs  Lab 02/09/18 1005  TROPONINI <0.03   BNP: Invalid input(s): POCBNP CBG: Recent Labs  Lab 02/04/18 1440  GLUCAP 88   D-Dimer No results for input(s): DDIMER in the last 72 hours. Hgb A1c No results for input(s): HGBA1C in the last 72 hours. Lipid Profile No results for input(s): CHOL, HDL, LDLCALC, TRIG, CHOLHDL, LDLDIRECT in the last 72 hours. Thyroid function studies No results for input(s): TSH, T4TOTAL, T3FREE, THYROIDAB in the last 72 hours.  Invalid input(s): FREET3 Anemia work up No results for input(s): VITAMINB12, FOLATE, FERRITIN, TIBC, IRON, RETICCTPCT in the last 72 hours. Urinalysis    Component Value Date/Time   BILIRUBINUR neg 10/22/2017 0855   PROTEINUR Negative 10/22/2017 0855   UROBILINOGEN 0.2 10/22/2017 0855   NITRITE neg 10/22/2017 0855   LEUKOCYTESUR Small (1+) (A) 10/22/2017 0855     SIGNED:   Cordelia Poche, MD [facility] 02/10/2018, 10:46 AM

## 2018-02-11 ENCOUNTER — Ambulatory Visit
Admission: RE | Admit: 2018-02-11 | Discharge: 2018-02-11 | Disposition: A | Discharge status: Home / Self Care | Payer: 59 | Source: Ambulatory Visit | Attending: Radiation Oncology | Admitting: Radiation Oncology

## 2018-02-11 DIAGNOSIS — C3402 Malignant neoplasm of left main bronchus: Secondary | ICD-10-CM | POA: Diagnosis not present

## 2018-02-11 DIAGNOSIS — Z510 Encounter for antineoplastic radiation therapy: Secondary | ICD-10-CM | POA: Diagnosis not present

## 2018-02-11 LAB — HIV ANTIBODY (ROUTINE TESTING W REFLEX): HIV Screen 4th Generation wRfx: NONREACTIVE

## 2018-02-11 NOTE — Progress Notes (Signed)
Pharmacy: Elquis Education  Educated patient on Eliquis for VTE indication on 02/10/18.  Reviewed with patient indication for medication, side effects, monitoring for bleeding, drug-drug inxns, etc.  Patient verbalized understanding.  Dia Sitter, PharmD, BCPS 02/11/2018 10:54 AM

## 2018-02-12 ENCOUNTER — Ambulatory Visit
Admission: RE | Admit: 2018-02-12 | Discharge: 2018-02-12 | Disposition: A | Discharge status: Home / Self Care | Payer: 59 | Source: Ambulatory Visit | Attending: Radiation Oncology | Admitting: Radiation Oncology

## 2018-02-12 DIAGNOSIS — Z510 Encounter for antineoplastic radiation therapy: Secondary | ICD-10-CM | POA: Diagnosis not present

## 2018-02-12 DIAGNOSIS — C3402 Malignant neoplasm of left main bronchus: Secondary | ICD-10-CM | POA: Diagnosis not present

## 2018-02-13 ENCOUNTER — Ambulatory Visit
Admission: RE | Admit: 2018-02-13 | Discharge: 2018-02-13 | Disposition: A | Discharge status: Home / Self Care | Payer: 59 | Source: Ambulatory Visit | Attending: Radiation Oncology | Admitting: Radiation Oncology

## 2018-02-13 DIAGNOSIS — Z510 Encounter for antineoplastic radiation therapy: Secondary | ICD-10-CM | POA: Diagnosis not present

## 2018-02-13 DIAGNOSIS — C3402 Malignant neoplasm of left main bronchus: Secondary | ICD-10-CM | POA: Diagnosis not present

## 2018-02-13 DIAGNOSIS — C3491 Malignant neoplasm of unspecified part of right bronchus or lung: Secondary | ICD-10-CM | POA: Diagnosis not present

## 2018-02-16 ENCOUNTER — Ambulatory Visit
Admission: RE | Admit: 2018-02-16 | Discharge: 2018-02-16 | Disposition: A | Discharge status: Home / Self Care | Payer: 59 | Source: Ambulatory Visit | Attending: Radiation Oncology | Admitting: Radiation Oncology

## 2018-02-16 DIAGNOSIS — Z510 Encounter for antineoplastic radiation therapy: Secondary | ICD-10-CM | POA: Diagnosis not present

## 2018-02-16 DIAGNOSIS — C3402 Malignant neoplasm of left main bronchus: Secondary | ICD-10-CM | POA: Diagnosis not present

## 2018-02-17 ENCOUNTER — Ambulatory Visit
Admission: RE | Admit: 2018-02-17 | Discharge: 2018-02-17 | Disposition: A | Discharge status: Home / Self Care | Payer: 59 | Source: Ambulatory Visit | Attending: Radiation Oncology | Admitting: Radiation Oncology

## 2018-02-17 DIAGNOSIS — Z510 Encounter for antineoplastic radiation therapy: Secondary | ICD-10-CM | POA: Diagnosis not present

## 2018-02-17 DIAGNOSIS — C3402 Malignant neoplasm of left main bronchus: Secondary | ICD-10-CM | POA: Diagnosis not present

## 2018-02-18 ENCOUNTER — Ambulatory Visit
Admission: RE | Admit: 2018-02-18 | Discharge: 2018-02-18 | Disposition: A | Discharge status: Home / Self Care | Payer: 59 | Source: Ambulatory Visit | Attending: Radiation Oncology | Admitting: Radiation Oncology

## 2018-02-18 DIAGNOSIS — Z510 Encounter for antineoplastic radiation therapy: Secondary | ICD-10-CM | POA: Diagnosis not present

## 2018-02-18 DIAGNOSIS — C3402 Malignant neoplasm of left main bronchus: Secondary | ICD-10-CM | POA: Diagnosis not present

## 2018-02-19 ENCOUNTER — Ambulatory Visit
Admission: RE | Admit: 2018-02-19 | Discharge: 2018-02-19 | Disposition: A | Discharge status: Home / Self Care | Payer: 59 | Source: Ambulatory Visit | Attending: Radiation Oncology | Admitting: Radiation Oncology

## 2018-02-19 ENCOUNTER — Telehealth: Payer: Self-pay | Admitting: Medical Oncology

## 2018-02-19 DIAGNOSIS — Z510 Encounter for antineoplastic radiation therapy: Secondary | ICD-10-CM | POA: Diagnosis not present

## 2018-02-19 DIAGNOSIS — C3402 Malignant neoplasm of left main bronchus: Secondary | ICD-10-CM | POA: Diagnosis not present

## 2018-02-19 NOTE — Telephone Encounter (Signed)
Tessalon refill requested. ( first written in hospital)

## 2018-02-19 NOTE — Telephone Encounter (Signed)
Ok

## 2018-02-20 ENCOUNTER — Ambulatory Visit
Admission: RE | Admit: 2018-02-20 | Discharge: 2018-02-20 | Disposition: A | Discharge status: Home / Self Care | Payer: 59 | Source: Ambulatory Visit | Attending: Radiation Oncology | Admitting: Radiation Oncology

## 2018-02-20 ENCOUNTER — Other Ambulatory Visit: Payer: Self-pay | Admitting: Radiation Oncology

## 2018-02-20 DIAGNOSIS — C3402 Malignant neoplasm of left main bronchus: Secondary | ICD-10-CM | POA: Diagnosis not present

## 2018-02-20 DIAGNOSIS — Z510 Encounter for antineoplastic radiation therapy: Secondary | ICD-10-CM | POA: Diagnosis not present

## 2018-02-20 MED ORDER — HYDROCOD POLST-CPM POLST ER 10-8 MG/5ML PO SUER: 5.0000 mL | Freq: Two times a day (BID) | ORAL | 0 refills | Status: AC | PRN

## 2018-02-23 ENCOUNTER — Encounter: Payer: Self-pay | Admitting: Family Medicine

## 2018-02-23 ENCOUNTER — Encounter: Payer: Self-pay | Admitting: Radiation Oncology

## 2018-02-23 ENCOUNTER — Ambulatory Visit
Admission: RE | Admit: 2018-02-23 | Discharge: 2018-02-23 | Disposition: A | Discharge status: Home / Self Care | Payer: 59 | Source: Ambulatory Visit | Attending: Radiation Oncology | Admitting: Radiation Oncology

## 2018-02-23 ENCOUNTER — Ambulatory Visit: Payer: 59 | Admitting: Family Medicine

## 2018-02-23 ENCOUNTER — Inpatient Hospital Stay: Payer: 59 | Admitting: Internal Medicine

## 2018-02-23 VITALS — BP 120/70 | HR 107 | Ht 60.98 in

## 2018-02-23 DIAGNOSIS — Z510 Encounter for antineoplastic radiation therapy: Secondary | ICD-10-CM | POA: Diagnosis not present

## 2018-02-23 DIAGNOSIS — C3492 Malignant neoplasm of unspecified part of left bronchus or lung: Secondary | ICD-10-CM

## 2018-02-23 DIAGNOSIS — J40 Bronchitis, not specified as acute or chronic: Secondary | ICD-10-CM | POA: Insufficient documentation

## 2018-02-23 DIAGNOSIS — C3402 Malignant neoplasm of left main bronchus: Secondary | ICD-10-CM | POA: Diagnosis not present

## 2018-02-23 DIAGNOSIS — C801 Malignant (primary) neoplasm, unspecified: Secondary | ICD-10-CM | POA: Insufficient documentation

## 2018-02-23 MED ORDER — OXYCODONE-ACETAMINOPHEN 5-325 MG PO TABS: 1.0000 | ORAL_TABLET | Freq: Three times a day (TID) | ORAL | 0 refills | Status: DC | PRN

## 2018-02-23 MED ORDER — AMOXICILLIN-POT CLAVULANATE 875-125 MG PO TABS: 1.0000 | ORAL_TABLET | Freq: Two times a day (BID) | ORAL | 0 refills | Status: DC

## 2018-02-23 NOTE — Progress Notes (Signed)
Subjective:  Patient ID: Rhonda Meyers, female    DOB: Jul 20, 1960  Age: 57 y.o. MRN: [id number]  CC: Establish Care   HPI [name] presents for establishment of care.  Significant recent past medical history of stage IV adenocarcinoma of the left lung diagnosed within the last month.  She is status post radiation therapy.  She has had her initial consultation with her oncologist.  She is accompanied by a gentleman identified as her cousin.  Patient basically feels wiped out.  She is fatigued with malaise.  She has had a cough over the last several days productive of dark phlegm.  She under went her last treatment of radiation therapy today.  Her cousin accompanying her today seems to be extremely concerned about her pain control.  He told me of his dissatisfaction with oncology for not returning phone calls regarding the patient's pain levels. History [name] has a past medical history of Cancer [facility]), Dyspnea, Hilar adenopathy (01/25/2018), Lung mass (01/25/2018), Mediastinal adenopathy  (01/25/2018), and Tobacco abuse.   She has a past surgical history that includes No past surgeries and Video bronchoscopy with endobronchial ultrasound (N/A, 01/28/2018).   Her family history includes CAD (age of onset: 28) in her mother; Cancer (age of onset: 73) in her maternal grandmother; Hypertension in her sister; Rheumatic fever in her mother.She reports that she quit smoking about 5 weeks ago. Her smoking use included cigarettes. She started smoking about 36 years ago. She has a 15.00 pack-year smoking history. She has never used smokeless tobacco. She reports that she drank alcohol. She reports that she does not use drugs.  Outpatient Medications Prior to Visit  Medication Sig Dispense Refill  . albuterol (PROVENTIL HFA;VENTOLIN HFA) 108 (90 Base) MCG/ACT inhaler Inhale 1-2 puffs into the lungs every 6 (six) hours as needed for wheezing or shortness of breath.    Marland Kitchen albuterol (PROVENTIL)  (2.5 MG/3ML) 0.083% nebulizer solution Take 3 mLs (2.5 mg total) by nebulization every 6 (six) hours as needed for wheezing or shortness of breath. 75 mL 12  . chlorpheniramine-HYDROcodone (TUSSIONEX) 10-8 MG/5ML SUER Take 5 mLs by mouth every 12 (twelve) hours as needed for cough. 140 mL 0  . ELIQUIS STARTER PACK (ELIQUIS STARTER PACK) 5 MG TABS Take as directed on package: start with two-5mg  tablets twice daily for 7 days. On day 8, switch to one-5mg  tablet twice daily. 1 each 0  . OVER THE COUNTER MEDICATION Take 1 capsule by mouth daily. Supplement containing "black seed", garlic, olive leaf, ginger, cayenne.    . polyethylene glycol (MIRALAX / GLYCOLAX) packet Take 17 g by mouth daily. 14 each 0  . senna-docusate (SENOKOT-S) 8.6-50 MG tablet Take 1 tablet by mouth 2 (two) times daily. 30 tablet 0  . Zinc 50 MG CAPS Take 50 mg by mouth daily.    Marland Kitchen oxyCODONE-acetaminophen (PERCOCET) 5-325 MG tablet Take 1 tablet by mouth every 6 (six) hours as needed for severe pain. (Patient not taking: Reported on 02/23/2018) 10 tablet 0   No facility-administered medications prior to visit.     ROS Review of Systems  Constitutional: Positive for fatigue. Negative for chills, fever and unexpected weight change.  HENT: Negative.   Eyes: Negative.   Respiratory: Positive for cough. Negative for chest tightness, shortness of breath and wheezing.   Cardiovascular: Negative.   Gastrointestinal: Negative.   Genitourinary: Negative.   Musculoskeletal: Negative for arthralgias and myalgias.  Skin: Negative for pallor and rash.  Psychiatric/Behavioral: Negative.  Objective:  BP 120/70   Pulse (!) 107   Ht 5' 0.98" (1.549 m)   LMP 09/03/2013   SpO2 92%   BMI 32.95 kg/m   Physical Exam  Constitutional: She is oriented to person, place, and time. She appears well-developed and well-nourished. No distress.  HENT:  Head: Normocephalic and atraumatic.  Right Ear: External ear normal.  Left Ear:  External ear normal.  Eyes: Right eye exhibits no discharge. Left eye exhibits no discharge. No scleral icterus.  Neck: No JVD present. No tracheal deviation present.  Cardiovascular: Normal rate, regular rhythm and normal heart sounds.  Pulmonary/Chest: Effort normal. No stridor. No respiratory distress. She has no wheezes. She has rales in the left middle field and the left lower field.  Abdominal: Bowel sounds are normal.  Neurological: She is alert and oriented to person, place, and time.  Skin: Skin is dry. She is not diaphoretic.  Psychiatric: She has a normal mood and affect. Her behavior is normal.      Assessment & Plan:   Glyn was seen today for establish care.  Diagnoses and all orders for this visit:  Bronchitis -     amoxicillin-clavulanate (AUGMENTIN) 875-125 MG tablet; Take 1 tablet by mouth 2 (two) times daily.  Adenocarcinoma ([place])  Malignant neoplasm of left lung stage 4 ([place]) -     oxyCODONE-acetaminophen (PERCOCET) 5-325 MG tablet; Take 1 tablet by mouth every 8 (eight) hours as needed for severe pain.   I have changed [name]'s oxyCODONE-acetaminophen. I am also having her start on amoxicillin-clavulanate. Additionally, I am having her maintain her Zinc, OVER THE COUNTER MEDICATION, albuterol, albuterol, ELIQUIS STARTER PACK, polyethylene glycol, senna-docusate, and chlorpheniramine-HYDROcodone.  Meds ordered this encounter  Medications  . oxyCODONE-acetaminophen (PERCOCET) 5-325 MG tablet    Sig: Take 1 tablet by mouth every 8 (eight) hours as needed for severe pain.    Dispense:  40 tablet    Refill:  0  . amoxicillin-clavulanate (AUGMENTIN) 875-125 MG tablet    Sig: Take 1 tablet by mouth 2 (two) times daily.    Dispense:  20 tablet    Refill:  0   We will go ahead and start patient on Augmentin for bronchitis versus radiation pneumonitis.  Have refilled her Percocet prescription.  Am concerned the patient's cousin voiced more  concerned about patient's pain control than the patient.  He suggested that I give her fentanyl patch.  Patient was directed to follow-up with oncology for further medication needed for pain control.  Follow-up: Return in about 1 month (around 03/26/2018), or if symptoms worsen or fail to improve.  Libby Maw, MD

## 2018-02-23 NOTE — Patient Instructions (Signed)
Lung Cancer Lung cancer occurs when abnormal cells in the lung grow out of control and form a mass (tumor). There are several types of lung cancer. The two most common types are:  Non-small cell. In this type of lung cancer, abnormal cells are larger and grow more slowly than those of small cell lung cancer.  Small cell. In this type of lung cancer, abnormal cells are smaller than those of non-small cell lung cancer. Small cell lung cancer gets worse faster than non-small cell lung cancer.  What are the causes? The leading cause of lung cancer is smoking tobacco. The second leading cause is radon exposure. What increases the risk?  Smoking tobacco.  Exposure to secondhand tobacco smoke.  Exposure to radon gas.  Exposure to asbestos.  Exposure to arsenic in drinking water.  Air pollution.  Family or personal history of lung cancer.  Lung radiation therapy.  Being older than 65 years. What are the signs or symptoms? In the early stages, symptoms may not be present. As the cancer progresses, symptoms may include:  A lasting cough, possibly with blood.  Fatigue.  Unexplained weight loss.  Shortness of breath.  Wheezing.  Chest pain.  Loss of appetite.  Symptoms of advanced lung cancer include:  Hoarseness.  Bone or joint pain.  Weakness.  Nail problems.  Face or arm swelling.  Paralysis of the face.  Drooping eyelids.  How is this diagnosed? Lung cancer can be identified with a physical exam and with tests such as:  A chest X-ray.  A CT scan.  Blood tests.  A biopsy.  After a diagnosis is made, you will have more tests to determine the stage of the cancer. The stages of non-small cell lung cancer are:  Stage 0, also called carcinoma in situ. At this stage, abnormal cells are found in the inner lining of your lung or lungs.  Stage I. At this stage, abnormal cells have grown into a tumor that is no larger than 5 cm across. The cancer has entered  the deeper lung tissue but has not yet entered the lymph nodes or other parts of the body.  Stage II. At this stage, the tumor is 7 cm across or smaller and has entered nearby lymph nodes. Or, the tumor is 5 cm across or smaller and has invaded surrounding tissue but is not found in nearby lymph nodes. There may be more than one tumor present.  Stage III. At this stage, the tumor may be any size. There may be more than one tumor in the lungs. The cancer cells have spread to the lymph nodes and possibly to other organs.  Stage IV. At this stage, there are tumors in both lungs and the cancer has spread to other areas of the body.  The stages of small cell lung cancer are:  Limited. At this stage, the cancer is found only on one side of the chest.  Extensive. At this stage, the cancer is in the lungs and in tissues on the other side of the chest. The cancer has spread to other organs or is found in the fluid between the layers of your lungs.  How is this treated? Depending on the type and stage of your lung cancer, you may be treated with:  Surgery. This is done to remove a tumor.  Radiation therapy. This treatment destroys cancer cells using X-rays or other types of radiation.  Chemotherapy. This treatment uses medicines to destroy cancer cells.  Targeted therapy. This treatment   aims to destroy only cancer cells instead of all cells as other therapies do.  You may also have a combination of treatments. Follow these instructions at home:  Do not use any tobacco products. This includes cigarettes, chewing tobacco, and electronic cigarettes. If you need help quitting, ask your health care provider.  Take medicines only as directed by your health care provider.  Eat a healthy diet. Work with a dietitian to make sure you are getting the nutrition you need.  Consider joining a support group or seeking counseling to help you cope with the stress of having lung cancer.  Let your cancer  specialist (oncologist) know if you are admitted to the hospital.  Keep all follow-up visits as directed by your health care provider. This is important. Contact a health care provider if:  You lose weight without trying.  You have a persistent cough and wheezing.  You feel short of breath.  You tire easily.  You experience bone or joint pain.  You have difficulty swallowing.  You feel hoarse or notice your voice changing.  Your pain medicine is not helping. Get help right away if:  You cough up blood.  You have new breathing problems.  You develop chest pain.  You develop swelling in: ? One or both ankles or legs. ? Your face, neck, or arms.  You are confused.  You experience paralysis in your face or a drooping eyelid. This information is not intended to replace advice given to you by your health care provider. Make sure you discuss any questions you have with your health care provider. Document Released: 07/29/2000 Document Revised: 09/28/2015 Document Reviewed: 08/26/2013 Elsevier Interactive Patient Education  2018 [employer] Inc[employer]  

## 2018-02-24 ENCOUNTER — Telehealth: Payer: Self-pay | Admitting: Internal Medicine

## 2018-02-24 LAB — GUARDANT 360

## 2018-02-24 NOTE — Telephone Encounter (Signed)
R/s appt from 10/21 per Rn Meade Maw - spoke with patient relative and they are aware of appt date and time.

## 2018-02-24 NOTE — Progress Notes (Signed)
Synopsis: Referred in September 2019 for lung mass by Dr. Julien Nordmann.  Subjective:   PATIENT ID: Rhonda Meyers: female DOB: 1960/07/31, MRN: [id number]  Chief Complaint  Patient presents with  . Hospitalization Follow-up    States she feels a little of everything fatigued and pain. Currently on Augmentin.     This is a pleasant 57 year old female with a past medical history significant for long-term smoking.  She presented to the emergency room on 01/17/2018 with complaints of cough, shortness of breath and sputum production.  Subsequent CT imaging revealed a left lower lobe collapse, volume loss concerning for central obstructive lesion.  CT of the chest was obtained which revealed a large left perihilar lung mass and bulky lymphadenopathy.  Patient was referred to pulmonary by Dr. Earlie Server for evaluation and diagnostic bronchoscopy with biopsies.  Patient's only complaint today is that she does feel short of breath with significant exertion.  She has been able to quit smoking since she is received at this new diagnosis.  She is very anxious about everything that is currently happening.  A large portion of the visit today was discussing with the patient as well as her significant other about the plans moving forward and what she should expect over the next few weeks.  We discussed the procedure which is planned for this Wednesday to include bronchoscopy at [facility] for sampling and tissue biopsy.  He does have complaints of pain within the left chest to include some pleuritic pain with deep breaths as well as tightness up underneath her left shoulder blade.  This is making her uncomfortable and makes it very difficult for her to sleep at night.  Patient denies fevers, chills she does have occasional night sweats.  And has lost some weight.  Patient denies hemoptysis.  OV 02/25/2018: The patient was recently seen in the emergency room and diagnosed with a pulmonary embolism.  She was  started on Eliquis.  She has follow-up with oncology as scheduled today.  She has completed several treatments with radiation oncology and has tolerated them well.  She does feel that her pain is controlled today she feels fatigued and has lost a significant amount of weight.  Her appetite is also decreased she has been trying to eat but she does feel that it has been a significant difference.  She has no fevers, chills, night sweats.  She denies abdominal pain.   Past Medical History:  Diagnosis Date  . Cancer ([place])   . Dyspnea    WIth activity  . Hilar adenopathy 01/25/2018  . Lung mass 01/25/2018  . Mediastinal adenopathy  01/25/2018  . Tobacco abuse      Family History  Problem Relation Age of Onset  . Cancer Maternal Grandmother 78       breast  . Rheumatic fever Mother   . CAD Mother 17       MI  . Hypertension Sister   . Diabetes Neg Hx   . Stroke Neg Hx      Past Surgical History:  Procedure Laterality Date  . NO PAST SURGERIES    . VIDEO BRONCHOSCOPY WITH ENDOBRONCHIAL ULTRASOUND N/A 01/28/2018   Procedure: VIDEO BRONCHOSCOPY WITH ENDOBRONCHIAL ULTRASOUND;  Surgeon: Garner Nash, DO;  Location: [facility] OR;  Service: Thoracic;  Laterality: N/A;    Social History   Socioeconomic History  . Marital status: Single    Spouse name: Not on file  . Number of children: Not on file  . Years of  education: Not on file  . Highest education level: Not on file  Occupational History  . Not on file  Social Needs  . Financial resource strain: Not on file  . Food insecurity:    Worry: Not on file    Inability: Not on file  . Transportation needs:    Medical: No    Non-medical: No  Tobacco Use  . Smoking status: Former Smoker    Packs/day: 0.50    Years: 30.00    Pack years: 15.00    Types: Cigarettes    Start date: 05/06/1981    Last attempt to quit: 01/17/2018    Years since quitting: 0.1  . Smokeless tobacco: Never Used  Substance and Sexual Activity  . Alcohol use: Not  Currently    Comment: 1 bottle wine on weekends  . Drug use: No  . Sexual activity: Not on file  Lifestyle  . Physical activity:    Days per week: Not on file    Minutes per session: Not on file  . Stress: Not on file  Relationships  . Social connections:    Talks on phone: Not on file    Gets together: Not on file    Attends religious service: Not on file    Active member of club or organization: Not on file    Attends meetings of clubs or organizations: Not on file    Relationship status: Not on file  . Intimate partner violence:    Fear of current or ex partner: Not on file    Emotionally abused: Not on file    Physically abused: Not on file    Forced sexual activity: Not on file  Other Topics Concern  . Not on file  Social History Narrative   Lives alone, no pets   Occupation: housekeeper at [employer]   Activity: regular walking   Diet: good water, fruit/svegetables daily     No Known Allergies   Outpatient Medications Prior to Visit  Medication Sig Dispense Refill  . albuterol (PROVENTIL HFA;VENTOLIN HFA) 108 (90 Base) MCG/ACT inhaler Inhale 1-2 puffs into the lungs every 6 (six) hours as needed for wheezing or shortness of breath.    Marland Kitchen albuterol (PROVENTIL) (2.5 MG/3ML) 0.083% nebulizer solution Take 3 mLs (2.5 mg total) by nebulization every 6 (six) hours as needed for wheezing or shortness of breath. 75 mL 12  . amoxicillin-clavulanate (AUGMENTIN) 875-125 MG tablet Take 1 tablet by mouth 2 (two) times daily. 20 tablet 0  . chlorpheniramine-HYDROcodone (TUSSIONEX) 10-8 MG/5ML SUER Take 5 mLs by mouth every 12 (twelve) hours as needed for cough. 140 mL 0  . ELIQUIS STARTER PACK (ELIQUIS STARTER PACK) 5 MG TABS Take as directed on package: start with two-5mg  tablets twice daily for 7 days. On day 8, switch to one-5mg  tablet twice daily. 1 each 0  . OVER THE COUNTER MEDICATION Take 1 capsule by mouth daily. Supplement containing "black seed", garlic, olive leaf, ginger,  cayenne.    Marland Kitchen oxyCODONE-acetaminophen (PERCOCET) 5-325 MG tablet Take 1 tablet by mouth every 8 (eight) hours as needed for severe pain. 40 tablet 0  . polyethylene glycol (MIRALAX / GLYCOLAX) packet Take 17 g by mouth daily. 14 each 0  . senna-docusate (SENOKOT-S) 8.6-50 MG tablet Take 1 tablet by mouth 2 (two) times daily. 30 tablet 0  . Zinc 50 MG CAPS Take 50 mg by mouth daily.     No facility-administered medications prior to visit.     Review of Systems  Constitutional: Negative for chills, fever, malaise/fatigue and weight loss.  HENT: Negative for hearing loss, sore throat and tinnitus.   Eyes: Negative for blurred vision and double vision.  Respiratory: Positive for cough, sputum production and shortness of breath. Negative for hemoptysis, wheezing and stridor.   Cardiovascular: Negative for chest pain, palpitations, orthopnea, leg swelling and PND.  Gastrointestinal: Negative for abdominal pain, constipation, diarrhea, heartburn, nausea and vomiting.  Genitourinary: Negative for dysuria, hematuria and urgency.  Musculoskeletal: Negative for joint pain and myalgias.  Skin: Negative for itching and rash.  Neurological: Negative for dizziness, tingling, weakness and headaches.  Endo/Heme/Allergies: Negative for environmental allergies. Does not bruise/bleed easily.  Psychiatric/Behavioral: Negative for depression. The patient is not nervous/anxious and does not have insomnia.   All other systems reviewed and are negative.    Objective:  Physical Exam  Constitutional: She is oriented to person, place, and time. She appears well-developed and well-nourished. No distress.  HENT:  Head: Normocephalic and atraumatic.  Mouth/Throat: Oropharynx is clear and moist.  Eyes: Pupils are equal, round, and reactive to light. Conjunctivae are normal. No scleral icterus.  Neck: Neck supple. No JVD present. No tracheal deviation present.  Cardiovascular: Normal rate, regular rhythm, normal  heart sounds and intact distal pulses.  No murmur heard. Pulmonary/Chest: No accessory muscle usage or stridor. No tachypnea. No respiratory distress. She has no wheezes. She has no rhonchi. She has rales.  Diminished left-sided breath sounds.  Abdominal: Soft. Bowel sounds are normal. She exhibits no distension. There is no tenderness.  Musculoskeletal: She exhibits no edema or tenderness.  Lymphadenopathy:    She has no cervical adenopathy.  Neurological: She is alert and oriented to person, place, and time.  Skin: Skin is warm and dry. Capillary refill takes less than 2 seconds. No rash noted.  Psychiatric: She has a normal mood and affect. Her behavior is normal.  Vitals reviewed.    Vitals:   02/25/18 1117  BP: 114/76  Pulse: (!) 114  Resp: (!) 98  Weight: 160 lb (72.6 kg)     on RA  BMI Readings from Last 3 Encounters:  02/25/18 30.25 kg/m  02/23/18 32.95 kg/m  02/10/18 32.94 kg/m   Wt Readings from Last 3 Encounters:  02/25/18 160 lb (72.6 kg)  02/10/18 174 lb 4 oz (79 kg)  02/05/18 174 lb 4 oz (79 kg)    CBC    Component Value Date/Time   WBC 11.6 (H) 02/10/2018 0230   RBC 4.86 02/10/2018 0230   HGB 13.5 02/10/2018 0230   HGB 13.8 02/05/2018 1214   HCT 41.2 02/10/2018 0230   PLT 235 02/10/2018 0230   PLT 252 02/05/2018 1214   MCV 84.8 02/10/2018 0230   MCH 27.8 02/10/2018 0230   MCHC 32.8 02/10/2018 0230   RDW 14.7 02/10/2018 0230   LYMPHSABS 1.6 02/09/2018 1005   MONOABS 1.0 02/09/2018 1005   EOSABS 0.2 02/09/2018 1005   BASOSABS 0.0 02/09/2018 1005   Chest Imaging: 01/17/2018 CT imaging of the chest: Large bulky mediastinal adenopathy and near total occlusion of the left mainstem from extrinsic tumor compression, likely endobronchial disease present. The patient's images have been independently reviewed by me.    October 2019 CTA chest New pulmonary embolism, left-sided lung collapse from central obstructing tumor. The patient's images have  been independently reviewed by me.    Pulmonary Functions Testing Results:  Pathology: None  Echocardiogram: None  Electrocardiogram: 01/17/2018 ECG reviewed from ED visit.  NSR  Heart Catheterization: None  Assessment & Plan:   Malignant neoplasm of left lung stage 4 ([facility])  Adenocarcinoma of left lung, stage 4 ([facility])  Cancer related pain  Pulmonary embolism without acute cor pulmonale, unspecified chronicity, unspecified pulmonary embolism type ([facility])  Discussion:  This is a 57 year old with a former history of smoking, quit at the diagnosis of her lung cancer.  She had a large left hilar central mass that was obstructing the left lung.  She was taken for bronchoscopy status post EBUS with TBNA.  Diagnosis of metastatic non-small cell lung cancer was made.  She has had subsequent radiation treatments by radiation oncology and has tolerated this well.  Unfortunately her course has been comp gated by a pulmonary embolism.  She was seen in the emergency room for this admitted to the hospital and started on Eliquis.  Was seen today after discharge from the hospital.   Currently pain is well controlled. She has albuterol as needed for dyspnea at home. She has oncology follow-up scheduled today. Patient was instructed to call us with any change in symptoms or respiratory complaints. Patient was counseled on maintaining weight.  Including recommend agents for dietary supplement such as Ensure or boost. Return to clinic in 3 months or as needed.  Greater than 50% of this patient's 25-minute visit was spent face-to-face discussing the above treatment plan and diagnostics completed thus far.    Current Outpatient Medications:  .  albuterol (PROVENTIL HFA;VENTOLIN HFA) 108 (90 Base) MCG/ACT inhaler, Inhale 1-2 puffs into the lungs every 6 (six) hours as needed for wheezing or shortness of breath., Disp: , Rfl:  .  albuterol (PROVENTIL) (2.5 MG/3ML) 0.083% nebulizer solution, Take 3  mLs (2.5 mg total) by nebulization every 6 (six) hours as needed for wheezing or shortness of breath., Disp: 75 mL, Rfl: 12 .  amoxicillin-clavulanate (AUGMENTIN) 875-125 MG tablet, Take 1 tablet by mouth 2 (two) times daily., Disp: 20 tablet, Rfl: 0 .  chlorpheniramine-HYDROcodone (TUSSIONEX) 10-8 MG/5ML SUER, Take 5 mLs by mouth every 12 (twelve) hours as needed for cough., Disp: 140 mL, Rfl: 0 .  ELIQUIS STARTER PACK (ELIQUIS STARTER PACK) 5 MG TABS, Take as directed on package: start with two-5mg  tablets twice daily for 7 days. On day 8, switch to one-5mg  tablet twice daily., Disp: 1 each, Rfl: 0 .  OVER THE COUNTER MEDICATION, Take 1 capsule by mouth daily. Supplement containing "black seed", garlic, olive leaf, ginger, cayenne., Disp: , Rfl:  .  oxyCODONE-acetaminophen (PERCOCET) 5-325 MG tablet, Take 1 tablet by mouth every 8 (eight) hours as needed for severe pain., Disp: 40 tablet, Rfl: 0 .  polyethylene glycol (MIRALAX / GLYCOLAX) packet, Take 17 g by mouth daily., Disp: 14 each, Rfl: 0 .  senna-docusate (SENOKOT-S) 8.6-50 MG tablet, Take 1 tablet by mouth 2 (two) times daily., Disp: 30 tablet, Rfl: 0 .  Zinc 50 MG CAPS, Take 50 mg by mouth daily., Disp: , Rfl:    Garner Nash, DO [facility] 02/25/2018 11:51 AM

## 2018-02-25 ENCOUNTER — Telehealth: Payer: Self-pay

## 2018-02-25 ENCOUNTER — Inpatient Hospital Stay (HOSPITAL_BASED_OUTPATIENT_CLINIC_OR_DEPARTMENT_OTHER): Payer: 59 | Admitting: Internal Medicine

## 2018-02-25 ENCOUNTER — Ambulatory Visit (INDEPENDENT_AMBULATORY_CARE_PROVIDER_SITE_OTHER): Payer: 59 | Admitting: Pulmonary Disease

## 2018-02-25 ENCOUNTER — Encounter: Payer: Self-pay | Admitting: Internal Medicine

## 2018-02-25 ENCOUNTER — Encounter: Payer: Self-pay | Admitting: Pulmonary Disease

## 2018-02-25 VITALS — BP 100/76 | HR 116 | Temp 97.8°F | Resp 18 | Ht 60.98 in | Wt 159.0 lb

## 2018-02-25 VITALS — BP 114/76 | HR 114 | Resp 98 | Wt 160.0 lb

## 2018-02-25 DIAGNOSIS — Z7189 Other specified counseling: Secondary | ICD-10-CM | POA: Diagnosis not present

## 2018-02-25 DIAGNOSIS — C7951 Secondary malignant neoplasm of bone: Secondary | ICD-10-CM | POA: Diagnosis present

## 2018-02-25 DIAGNOSIS — I214 Non-ST elevation (NSTEMI) myocardial infarction: Secondary | ICD-10-CM | POA: Diagnosis not present

## 2018-02-25 DIAGNOSIS — C3492 Malignant neoplasm of unspecified part of left bronchus or lung: Secondary | ICD-10-CM

## 2018-02-25 DIAGNOSIS — R079 Chest pain, unspecified: Secondary | ICD-10-CM | POA: Diagnosis not present

## 2018-02-25 DIAGNOSIS — G893 Neoplasm related pain (acute) (chronic): Secondary | ICD-10-CM | POA: Diagnosis present

## 2018-02-25 DIAGNOSIS — Z5112 Encounter for antineoplastic immunotherapy: Secondary | ICD-10-CM | POA: Diagnosis not present

## 2018-02-25 DIAGNOSIS — C787 Secondary malignant neoplasm of liver and intrahepatic bile duct: Secondary | ICD-10-CM | POA: Diagnosis not present

## 2018-02-25 DIAGNOSIS — C3402 Malignant neoplasm of left main bronchus: Principal | ICD-10-CM | POA: Diagnosis present

## 2018-02-25 DIAGNOSIS — Z720 Tobacco use: Secondary | ICD-10-CM | POA: Diagnosis not present

## 2018-02-25 DIAGNOSIS — I2699 Other pulmonary embolism without acute cor pulmonale: Secondary | ICD-10-CM

## 2018-02-25 DIAGNOSIS — Z923 Personal history of irradiation: Secondary | ICD-10-CM

## 2018-02-25 DIAGNOSIS — Z5111 Encounter for antineoplastic chemotherapy: Secondary | ICD-10-CM

## 2018-02-25 DIAGNOSIS — Z87891 Personal history of nicotine dependence: Secondary | ICD-10-CM

## 2018-02-25 DIAGNOSIS — C7970 Secondary malignant neoplasm of unspecified adrenal gland: Secondary | ICD-10-CM | POA: Diagnosis not present

## 2018-02-25 DIAGNOSIS — Z7901 Long term (current) use of anticoagulants: Secondary | ICD-10-CM

## 2018-02-25 DIAGNOSIS — Z86711 Personal history of pulmonary embolism: Secondary | ICD-10-CM

## 2018-02-25 DIAGNOSIS — E46 Unspecified protein-calorie malnutrition: Secondary | ICD-10-CM | POA: Diagnosis not present

## 2018-02-25 DIAGNOSIS — Z79899 Other long term (current) drug therapy: Secondary | ICD-10-CM | POA: Diagnosis not present

## 2018-02-25 DIAGNOSIS — R112 Nausea with vomiting, unspecified: Secondary | ICD-10-CM | POA: Diagnosis present

## 2018-02-25 DIAGNOSIS — R918 Other nonspecific abnormal finding of lung field: Secondary | ICD-10-CM | POA: Diagnosis not present

## 2018-02-25 DIAGNOSIS — C3490 Malignant neoplasm of unspecified part of unspecified bronchus or lung: Secondary | ICD-10-CM

## 2018-02-25 DIAGNOSIS — Z803 Family history of malignant neoplasm of breast: Secondary | ICD-10-CM

## 2018-02-25 MED ORDER — FOLIC ACID 1 MG PO TABS: 1.0000 mg | ORAL_TABLET | Freq: Every day | ORAL | 4 refills | Status: DC

## 2018-02-25 MED ORDER — CYANOCOBALAMIN 1000 MCG/ML IJ SOLN
1000.0000 ug | Freq: Once | INTRAMUSCULAR | Status: AC
  Administered 2018-02-25: 1000 ug via INTRAMUSCULAR

## 2018-02-25 MED ORDER — PROCHLORPERAZINE MALEATE 10 MG PO TABS: 10.0000 mg | ORAL_TABLET | Freq: Four times a day (QID) | ORAL | 0 refills | Status: DC | PRN

## 2018-02-25 MED ORDER — CYANOCOBALAMIN 1000 MCG/ML IJ SOLN
INTRAMUSCULAR | Status: AC
  Filled 2018-02-25: qty 1

## 2018-02-25 NOTE — Progress Notes (Signed)
[place] [phone]   [phone]  OFFICE PROGRESS NOTE  Patient, No Pcp Per No address on file  DIAGNOSIS: Stage IV (T3, N3, M1c) non-small cell lung cancer, adenocarcinoma presented with obstructive left lung mass, left hilar and mediastinal lymphadenopathy as well as metastatic disease to the liver, adrenal and bone diagnosed in September 2019. Molecular studies by [facility] showed no actionable mutations.  PRIOR THERAPY: Palliative radiotherapy to the metastatic bone disease as well as the obstructive left upper lobe lung mass under the care of Dr. Lisbeth Renshaw.  CURRENT THERAPY: Systemic chemotherapy with carboplatin for AUC of 5, Alimta 500 mg/M2 and Keytruda 200 mg IV every 3 weeks.  First dose March 04, 2018.  INTERVAL HISTORY: Rhonda Meyers 57 y.o. female returns to the clinic today for follow-up visit accompanied by her cousin.  The patient is feeling much better today except for the shortness of breath at baseline increased with exertion.  She denied having any significant chest or back pain at this point.  She requested refill of pain medication yesterday but she is feeling much better today.  She denied having any nausea, vomiting, diarrhea or constipation.  She denied having any fever or chills.  She has no headache or visual changes.  She completed a course of palliative radiotherapy to the left upper lobe lung mass as well as the metastatic bone disease and she is feeling better. The patient is here today for evaluation and recommendation regarding treatment of her condition.  MEDICAL HISTORY: Past Medical History:  Diagnosis Date  . Cancer ([place])   . Dyspnea    WIth activity  . Hilar adenopathy 01/25/2018  . Lung mass 01/25/2018  . Mediastinal adenopathy  01/25/2018  . Tobacco abuse     ALLERGIES:  has No Known Allergies.  MEDICATIONS:  Current Outpatient Medications  Medication Sig Dispense Refill  . albuterol (PROVENTIL  HFA;VENTOLIN HFA) 108 (90 Base) MCG/ACT inhaler Inhale 1-2 puffs into the lungs every 6 (six) hours as needed for wheezing or shortness of breath.    Marland Kitchen albuterol (PROVENTIL) (2.5 MG/3ML) 0.083% nebulizer solution Take 3 mLs (2.5 mg total) by nebulization every 6 (six) hours as needed for wheezing or shortness of breath. 75 mL 12  . amoxicillin-clavulanate (AUGMENTIN) 875-125 MG tablet Take 1 tablet by mouth 2 (two) times daily. 20 tablet 0  . chlorpheniramine-HYDROcodone (TUSSIONEX) 10-8 MG/5ML SUER Take 5 mLs by mouth every 12 (twelve) hours as needed for cough. 140 mL 0  . ELIQUIS STARTER PACK (ELIQUIS STARTER PACK) 5 MG TABS Take as directed on package: start with two-5mg  tablets twice daily for 7 days. On day 8, switch to one-5mg  tablet twice daily. 1 each 0  . OVER THE COUNTER MEDICATION Take 1 capsule by mouth daily. Supplement containing "black seed", garlic, olive leaf, ginger, cayenne.    Marland Kitchen oxyCODONE-acetaminophen (PERCOCET) 5-325 MG tablet Take 1 tablet by mouth every 8 (eight) hours as needed for severe pain. 40 tablet 0  . polyethylene glycol (MIRALAX / GLYCOLAX) packet Take 17 g by mouth daily. 14 each 0  . senna-docusate (SENOKOT-S) 8.6-50 MG tablet Take 1 tablet by mouth 2 (two) times daily. 30 tablet 0  . Zinc 50 MG CAPS Take 50 mg by mouth daily.     No current facility-administered medications for this visit.     SURGICAL HISTORY:  Past Surgical History:  Procedure Laterality Date  . NO PAST SURGERIES    . VIDEO BRONCHOSCOPY WITH  ENDOBRONCHIAL ULTRASOUND N/A 01/28/2018   Procedure: VIDEO BRONCHOSCOPY WITH ENDOBRONCHIAL ULTRASOUND;  Surgeon: Garner Nash, DO;  Location: [facility] OR;  Service: Thoracic;  Laterality: N/A;    REVIEW OF SYSTEMS:  Constitutional: positive for fatigue Eyes: negative Ears, nose, mouth, throat, and face: negative Respiratory: positive for cough and dyspnea on exertion Cardiovascular: negative Gastrointestinal:  negative Genitourinary:negative Integument/breast: negative Hematologic/lymphatic: negative Musculoskeletal:negative Neurological: negative Behavioral/Psych: negative Endocrine: negative Allergic/Immunologic: negative   PHYSICAL EXAMINATION: General appearance: alert, cooperative, fatigued and no distress Head: Normocephalic, without obvious abnormality, atraumatic Neck: no adenopathy, no JVD, supple, symmetrical, trachea midline and thyroid not enlarged, symmetric, no tenderness/mass/nodules Lymph nodes: Cervical, supraclavicular, and axillary nodes normal. Resp: diminished breath sounds LUL and dullness to percussion LUL Back: symmetric, no curvature. ROM normal. No CVA tenderness. Cardio: regular rate and rhythm, S1, S2 normal, no murmur, click, rub or gallop GI: soft, non-tender; bowel sounds normal; no masses,  no organomegaly Extremities: extremities normal, atraumatic, no cyanosis or edema Neurologic: Alert and oriented X 3, normal strength and tone. Normal symmetric reflexes. Normal coordination and gait  ECOG PERFORMANCE STATUS: 1 - Symptomatic but completely ambulatory  Blood pressure 100/76, pulse (!) 116, temperature 97.8 F (36.6 C), temperature source Oral, resp. rate 18, height 5' 0.98" (1.549 m), weight 159 lb (72.1 kg), last menstrual period 09/03/2013, SpO2 100 %.  LABORATORY DATA: Lab Results  Component Value Date   WBC 11.6 (H) 02/10/2018   HGB 13.5 02/10/2018   HCT 41.2 02/10/2018   MCV 84.8 02/10/2018   PLT 235 02/10/2018      Chemistry      Component Value Date/Time   NA 140 02/10/2018 0230   K 4.1 02/10/2018 0230   CL 102 02/10/2018 0230   CO2 26 02/10/2018 0230   BUN 13 02/10/2018 0230   CREATININE 0.80 02/10/2018 0230   CREATININE 0.78 02/05/2018 1214      Component Value Date/Time   CALCIUM 9.0 02/10/2018 0230   ALKPHOS 79 02/10/2018 0230   AST 11 (L) 02/10/2018 0230   AST 9 (L) 02/05/2018 1214   ALT 11 02/10/2018 0230   ALT 10  02/05/2018 1214   BILITOT 0.6 02/10/2018 0230   BILITOT 0.4 02/05/2018 1214       RADIOGRAPHIC STUDIES: Dg Chest 2 View  Result Date: 02/09/2018 CLINICAL DATA:  Stage IV lung the ligament C. New onset hemoptysis this morning associated with shortness of breath and posterior chest pain. Former smoker. EXAM: CHEST - 2 VIEW COMPARISON:  CT scan of the chest of February 04, 2018 and PA and lateral chest x-ray of January 17, 2018 FINDINGS: Opacification of the left hemithorax is nearly total similar to that seen on the recent CT scan. There is some shift of the mediastinum toward the left. The right lung is well-expanded and clear. The observed bony thorax is unremarkable. IMPRESSION: Near total atelectasis of the left lung with large left pleural effusion consistent with a central obstructing lesion. And mild shift of the mediastinum from right to left. Clear right lung. Electronically Signed   By: David  Martinique M.D.   On: 02/09/2018 10:57   Ct Angio Chest Pe W And/or Wo Contrast  Result Date: 02/09/2018 CLINICAL DATA:  Hemoptysis, shortness of breath EXAM: CT ANGIOGRAPHY CHEST WITH CONTRAST TECHNIQUE: Multidetector CT imaging of the chest was performed using the standard protocol during bolus administration of intravenous contrast. Multiplanar CT image reconstructions and MIPs were obtained to evaluate the vascular anatomy. CONTRAST:  12mL ISOVUE-370 IOPAMIDOL (ISOVUE-370)  INJECTION 76% COMPARISON:  Chest x-ray 02/09/2018. Chest CT 01/17/2018. PET CT 02/04/2018. FINDINGS: Cardiovascular: Heart is normal size. Visualized aorta normal caliber. Filling defects are noted within right lower lobe pulmonary arterial branches compatible with nonocclusive pulmonary embolus. No evidence of right heart strain Mediastinum/Nodes: Bulky right AP window nodal mass again noted, measuring 4.2 cm compared to 3.6 cm on prior PET CT. Other enlarged mediastinal lymph nodes in the right paratracheal region, subcarinal region  and prevascular regions. Lungs/Pleura: Complete drowned lung on the left again noted, stable since prior PET CT due to occlusion of the left mainstem bronchus related to tumor and adenopathy. Moderate left pleural effusion. Right lung is clear. Upper Abdomen: Right adrenal nodule noted, stable. Musculoskeletal: Chest wall soft tissues are unremarkable. Destructive lytic lesion noted in the left 12th medial rib at the costovertebral junction on image 119 of series 7. Central lucent lesion noted in the the T1 vertebral body compatible with metastasis. Review of the MIP images confirms the above findings. IMPRESSION: Drowned lung on the left due to obstructing tumor and adenopathy obstructing the left mainstem bronchus. Moderate left pleural effusion. Right lung clear. Nonocclusive pulmonary emboli noted in the right lower lobe pulmonary artery. No evidence of right heart strain. Bony metastatic disease involving the medial left 12th rib and the T1 vertebral body. These results were called by telephone at the time of interpretation on 02/09/2018 at 11:32 am to Dr. Gareth Morgan , who verbally acknowledged these results. Electronically Signed   By: Rolm Baptise M.D.   On: 02/09/2018 11:33   Nm Pet Image Initial (pi) Skull Base To Thigh  Result Date: 02/04/2018 CLINICAL DATA:  Initial treatment strategy for left hilar mass and adenopathy. EXAM: NUCLEAR MEDICINE PET SKULL BASE TO THIGH TECHNIQUE: 8.3 mCi F-18 FDG was injected intravenously. Full-ring PET imaging was performed from the skull base to thigh after the radiotracer. CT data was obtained and used for attenuation correction and anatomic localization. Fasting blood glucose: 88 mg/dl COMPARISON:  CT chest 01/17/2018 FINDINGS: Mediastinal blood pool activity: SUV max 2.61 NECK: There is asymmetric uptake noted in the right arytenoid area. This is likely due to a paralyzed left vocal cord which appears to be bulging in the midline and likely due to recurrent  laryngeal nerve involvement as it courses under the aortic arch on the left side. No neck mass or lymphadenopathy. Incidental CT findings: none CHEST: Completely drowned/obstructed left lung due to tumor obstructing the left mainstem bronchus. There are 3 hypermetabolic lesions in the left lung. The largest is at the left lung base and measures approximately 4 cm. The SUV max is 13.97. Slightly more superiorly in the left lower lobe is a 3 cm nodule has an SUV max of 19.3. The third lesion is in the left upper lobe and measures approximately 19 mm. SUV max is 9.11. 2 cm left hilar node is hypermetabolic with SUV max of 35.7. Large necrotic appearing subcarinal mass measures 3.6 cm and SUV max is 31.45. This also involves the left mainstem bronchus which is filled with tumor. Two metastatic prevascular lymph nodes are also noted and there is contralateral right paratracheal metastatic adenopathy. No definite metastatic pulmonary nodules are noted in the right lung. Incidental CT findings: Small left pleural effusion. ABDOMEN/PELVIS: Single metastatic lesion in the liver is noted centrally near the caudate lobe. This is difficult to identify on the non-contrast CT scan. It measures approximately 16 mm and the SUV max is 8.12. Bilateral adrenal gland metastasis. The  right has an SUV max of 9.41 and the left has an SUV max of 6.13. Small lymph node in the gastrohepatic ligament region has an SUV max of 7.06. Incidental CT findings: Large anterior abdominal wall hernia containing fat and vessels. Soft tissue density in the hernia is slightly hypermetabolic and may be due to fat necrosis or prior incarceration. No overt tumor. SKELETON: Osseous metastatic disease is demonstrated. There are lesions involving the left clavicle, both upper scapulae, numerous vertebral bodies, transverse processes, ribs, sternum and pelvis. No spinal canal compromise is demonstrated. Incidental CT findings: none IMPRESSION: 1. Completely  drowned/obstructed left lung due to extensive tumor in the left mainstem bronchus with surrounding bulky mediastinal lymphadenopathy. 2. Three left lung lesions along with left hilar, prevascular, AP window, subcarinal and right paratracheal adenopathy. 3. Single hepatic metastatic focus. 4. Bilateral adrenal gland metastasis. 5. Extensive osseous metastatic disease. Electronically Signed   By: Marijo Sanes M.D.   On: 02/04/2018 16:50    ASSESSMENT AND PLAN: This is a very pleasant 57 years old African-American female recently diagnosed with a stage IV non-small cell lung cancer, poorly differentiated favoring adenocarcinoma presented with large obstructive mass in the left upper lobe with hilar and mediastinal lymphadenopathy as well as metastatic disease to the bone, liver and adrenal gland in August and September 2019. The patient underwent a course of palliative radiotherapy to the left upper lobe lung mass as well as bone disease and she is feeling much better. I recommended for her to complete the staging work-up and I order CT scan of the head with and without contrast at the patient was unable to tolerate MRI of the brain. I discussed with the patient her treatment options including palliative care and hospice referral versus consideration of palliative systemic chemotherapy with carboplatin for AUC of 5, Alimta 500 mg/M2 and Keytruda 200 mg IV every 3 weeks.  The patient is interested in proceeding with systemic chemotherapy as soon as possible. I discussed with the patient the adverse effect of this treatment including but not limited to alopecia, myelosuppression, nausea and vomiting, peripheral neuropathy, liver or renal dysfunction as well as the immunotherapy mediated adverse effects. She is expected to start the first cycle of this treatment next week. We will arrange for the patient to have a chemotherapy education class before the first dose of her treatment. The patient will receive  vitamin B12 injection today. I will also recommended for the patient to start treatment with folic acid 1 mg p.o. daily 1 week before her treatment.  I also will send prescription for Compazine 10 mg p.o. every 6 hours as needed for nausea to her pharmacy. The patient will come back for follow-up visit 2 weeks for evaluation and management of any adverse effect of her treatment. She was advised to call immediately if she has any concerning symptoms in the interval. The patient voices understanding of current disease status and treatment options and is in agreement with the current care plan.  All questions were answered. The patient knows to call the clinic with any problems, questions or concerns. We can certainly see the patient much sooner if necessary.  I spent 20 minutes counseling the patient face to face. The total time spent in the appointment was 30 minutes.  Disclaimer: This note was dictated with voice recognition software. Similar sounding words can inadvertently be transcribed and may not be corrected upon review.

## 2018-02-25 NOTE — Patient Instructions (Signed)
Thank you for visiting Dr. Valeta Harms at [facility].  Today we recommend the following:  Recommend dietary supplement and increased oral intake Follow up with oncology today.  Return in about 3 months (around 05/28/2018), or if symptoms worsen or fail to improve.

## 2018-02-25 NOTE — Telephone Encounter (Signed)
Printed avs and calender of upcoming appointment. Per 10/23 los. Infusion int hte book for approval for 10/31

## 2018-02-25 NOTE — Progress Notes (Signed)
START ON PATHWAY REGIMEN - Non-Small Cell Lung     A cycle is every 21 days:     Pembrolizumab      Pemetrexed      Carboplatin   **Always confirm dose/schedule in your pharmacy ordering system**  Patient Characteristics: Stage IV Metastatic, Nonsquamous, Initial Chemotherapy/Immunotherapy, PS = 0, 1, ALK Translocation Negative/Unknown and EGFR Mutation Negative/Non-Sensitizing/Unknown, PD-L1 Expression Positive 1-49% (TPS) / Negative / Not Tested / Awaiting Test Results  and Immunotherapy Candidate AJCC T Category: T3 Current Disease Status: Distant Metastases AJCC N Category: N3 AJCC M Category: M1c AJCC 8 Stage Grouping: IVB Histology: Nonsquamous Cell ROS1 Rearrangement Status: Negative T790M Mutation Status: Not Applicable - EGFR Mutation Negative/Unknown Other Mutations/Biomarkers: No Other Actionable Mutations NTRK Gene Fusion Status: Negative PD-L1 Expression Status: Did Not Order Test Chemotherapy/Immunotherapy LOT: Initial Chemotherapy/Immunotherapy Molecular Targeted Therapy: Not Appropriate ALK Translocation Status: Negative EGFR Mutation Status: Negative/Wild Type BRAF [id number] Mutation Status: Negative Performance Status: PS = 0, 1 Immunotherapy Candidate Status: Candidate for Immunotherapy Intent of Therapy: Non-Curative / Palliative Intent, Discussed with Patient

## 2018-02-26 ENCOUNTER — Ambulatory Visit: Payer: 59 | Admitting: Internal Medicine

## 2018-02-27 ENCOUNTER — Other Ambulatory Visit: Payer: Self-pay

## 2018-02-27 ENCOUNTER — Emergency Department (HOSPITAL_COMMUNITY): Payer: 59

## 2018-02-27 ENCOUNTER — Inpatient Hospital Stay (HOSPITAL_COMMUNITY)
Admission: EM | Admit: 2018-02-27 | Discharge: 2018-03-02 | DRG: 181 | Disposition: A | Discharge status: Home / Self Care | Payer: 59 | Attending: Internal Medicine | Admitting: Internal Medicine

## 2018-02-27 ENCOUNTER — Encounter (HOSPITAL_COMMUNITY): Payer: Self-pay | Admitting: Emergency Medicine

## 2018-02-27 DIAGNOSIS — Z79899 Other long term (current) drug therapy: Secondary | ICD-10-CM | POA: Diagnosis not present

## 2018-02-27 DIAGNOSIS — I2782 Chronic pulmonary embolism: Secondary | ICD-10-CM | POA: Diagnosis not present

## 2018-02-27 DIAGNOSIS — C787 Secondary malignant neoplasm of liver and intrahepatic bile duct: Secondary | ICD-10-CM | POA: Diagnosis present

## 2018-02-27 DIAGNOSIS — C3490 Malignant neoplasm of unspecified part of unspecified bronchus or lung: Secondary | ICD-10-CM

## 2018-02-27 DIAGNOSIS — Z803 Family history of malignant neoplasm of breast: Secondary | ICD-10-CM | POA: Diagnosis not present

## 2018-02-27 DIAGNOSIS — R000 Tachycardia, unspecified: Secondary | ICD-10-CM | POA: Diagnosis not present

## 2018-02-27 DIAGNOSIS — I2699 Other pulmonary embolism without acute cor pulmonale: Secondary | ICD-10-CM | POA: Diagnosis present

## 2018-02-27 DIAGNOSIS — I214 Non-ST elevation (NSTEMI) myocardial infarction: Secondary | ICD-10-CM | POA: Diagnosis present

## 2018-02-27 DIAGNOSIS — R079 Chest pain, unspecified: Secondary | ICD-10-CM | POA: Diagnosis present

## 2018-02-27 DIAGNOSIS — E46 Unspecified protein-calorie malnutrition: Secondary | ICD-10-CM | POA: Diagnosis present

## 2018-02-27 DIAGNOSIS — C3412 Malignant neoplasm of upper lobe, left bronchus or lung: Secondary | ICD-10-CM | POA: Diagnosis not present

## 2018-02-27 DIAGNOSIS — R071 Chest pain on breathing: Secondary | ICD-10-CM | POA: Diagnosis not present

## 2018-02-27 DIAGNOSIS — C3402 Malignant neoplasm of left main bronchus: Secondary | ICD-10-CM | POA: Diagnosis present

## 2018-02-27 DIAGNOSIS — R112 Nausea with vomiting, unspecified: Secondary | ICD-10-CM | POA: Diagnosis present

## 2018-02-27 DIAGNOSIS — C7970 Secondary malignant neoplasm of unspecified adrenal gland: Secondary | ICD-10-CM | POA: Diagnosis present

## 2018-02-27 DIAGNOSIS — Z87891 Personal history of nicotine dependence: Secondary | ICD-10-CM | POA: Diagnosis not present

## 2018-02-27 DIAGNOSIS — Z7901 Long term (current) use of anticoagulants: Secondary | ICD-10-CM | POA: Diagnosis not present

## 2018-02-27 DIAGNOSIS — G893 Neoplasm related pain (acute) (chronic): Secondary | ICD-10-CM | POA: Diagnosis present

## 2018-02-27 DIAGNOSIS — I248 Other forms of acute ischemic heart disease: Secondary | ICD-10-CM | POA: Diagnosis not present

## 2018-02-27 DIAGNOSIS — R0902 Hypoxemia: Secondary | ICD-10-CM | POA: Diagnosis not present

## 2018-02-27 DIAGNOSIS — Z720 Tobacco use: Secondary | ICD-10-CM | POA: Diagnosis present

## 2018-02-27 DIAGNOSIS — C7951 Secondary malignant neoplasm of bone: Secondary | ICD-10-CM

## 2018-02-27 DIAGNOSIS — Z86711 Personal history of pulmonary embolism: Secondary | ICD-10-CM | POA: Diagnosis not present

## 2018-02-27 DIAGNOSIS — Z923 Personal history of irradiation: Secondary | ICD-10-CM | POA: Diagnosis not present

## 2018-02-27 DIAGNOSIS — R918 Other nonspecific abnormal finding of lung field: Secondary | ICD-10-CM | POA: Diagnosis not present

## 2018-02-27 LAB — CBC WITH DIFFERENTIAL/PLATELET
ABS IMMATURE GRANULOCYTES: 0.07 10*3/uL (ref 0.00–0.07)
BASOS ABS: 0 10*3/uL (ref 0.0–0.1)
Basophils Relative: 0 %
Eosinophils Absolute: 0.2 10*3/uL (ref 0.0–0.5)
Eosinophils Relative: 1 %
HEMATOCRIT: 46.7 % — AB (ref 36.0–46.0)
HEMOGLOBIN: 14.7 g/dL (ref 12.0–15.0)
Immature Granulocytes: 1 %
LYMPHS ABS: 0.4 10*3/uL — AB (ref 0.7–4.0)
LYMPHS PCT: 3 %
MCH: 26.7 pg (ref 26.0–34.0)
MCHC: 31.5 g/dL (ref 30.0–36.0)
MCV: 84.9 fL (ref 80.0–100.0)
Monocytes Absolute: 1.4 10*3/uL — ABNORMAL HIGH (ref 0.1–1.0)
Monocytes Relative: 10 %
NEUTROS PCT: 85 %
NRBC: 0 % (ref 0.0–0.2)
Neutro Abs: 12.2 10*3/uL — ABNORMAL HIGH (ref 1.7–7.7)
Platelets: 200 10*3/uL (ref 150–400)
RBC: 5.5 MIL/uL — ABNORMAL HIGH (ref 3.87–5.11)
RDW: 14.3 % (ref 11.5–15.5)
WBC: 14.2 10*3/uL — ABNORMAL HIGH (ref 4.0–10.5)

## 2018-02-27 LAB — BASIC METABOLIC PANEL
ANION GAP: 15 (ref 5–15)
BUN: 19 mg/dL (ref 6–20)
CALCIUM: 9.3 mg/dL (ref 8.9–10.3)
CHLORIDE: 100 mmol/L (ref 98–111)
CO2: 22 mmol/L (ref 22–32)
Creatinine, Ser: 0.84 mg/dL (ref 0.44–1.00)
GFR calc non Af Amer: >60 mL/min (ref >60)
Glucose, Bld: 99 mg/dL (ref 70–99)
Potassium: 3.7 mmol/L (ref 3.5–5.1)
Sodium: 137 mmol/L (ref 135–145)

## 2018-02-27 LAB — TROPONIN I
TROPONIN I: 0.22 ng/mL — AB (ref <0.03)
Troponin I: 0.24 ng/mL (ref <0.03)

## 2018-02-27 MED ORDER — ONDANSETRON HCL 4 MG/2ML IJ SOLN
4.0000 mg | Freq: Once | INTRAMUSCULAR | Status: AC
  Administered 2018-02-27: 4 mg via INTRAVENOUS
  Filled 2018-02-27: qty 2

## 2018-02-27 MED ORDER — SODIUM CHLORIDE 0.9 % IV BOLUS
500.0000 mL | Freq: Once | INTRAVENOUS | Status: AC
  Administered 2018-02-27: 500 mL via INTRAVENOUS

## 2018-02-27 MED ORDER — FOLIC ACID 1 MG PO TABS
1.0000 mg | ORAL_TABLET | Freq: Every day | ORAL | Status: DC
  Administered 2018-02-28 – 2018-03-02 (×3): 1 mg via ORAL
  Filled 2018-02-27 (×3): qty 1

## 2018-02-27 MED ORDER — HYDROMORPHONE HCL 1 MG/ML IJ SOLN: 1.0000 mg | Freq: Once | INTRAMUSCULAR | Status: DC

## 2018-02-27 MED ORDER — HEPARIN (PORCINE) IN NACL 100-0.45 UNIT/ML-% IJ SOLN
1050.0000 [IU]/h | INTRAMUSCULAR | Status: DC
  Administered 2018-02-27: 800 [IU]/h via INTRAVENOUS
  Filled 2018-02-27: qty 250

## 2018-02-27 MED ORDER — ALBUTEROL SULFATE HFA 108 (90 BASE) MCG/ACT IN AERS: 1.0000 | INHALATION_SPRAY | Freq: Four times a day (QID) | RESPIRATORY_TRACT | Status: DC | PRN

## 2018-02-27 MED ORDER — IBUPROFEN 200 MG PO TABS
400.0000 mg | ORAL_TABLET | Freq: Three times a day (TID) | ORAL | Status: DC
  Administered 2018-02-27 – 2018-03-01 (×4): 400 mg via ORAL
  Filled 2018-02-27 (×6): qty 2

## 2018-02-27 MED ORDER — SODIUM CHLORIDE 0.9 % IJ SOLN
INTRAMUSCULAR | Status: AC
  Filled 2018-02-27: qty 50

## 2018-02-27 MED ORDER — DICLOFENAC SODIUM 1 % TD GEL
2.0000 g | Freq: Four times a day (QID) | TRANSDERMAL | Status: DC
  Administered 2018-02-27 – 2018-03-02 (×7): 2 g via TOPICAL
  Filled 2018-02-27: qty 100

## 2018-02-27 MED ORDER — ACETAMINOPHEN 650 MG RE SUPP: 650.0000 mg | Freq: Four times a day (QID) | RECTAL | Status: DC | PRN

## 2018-02-27 MED ORDER — IOPAMIDOL (ISOVUE-370) INJECTION 76%
INTRAVENOUS | Status: AC
  Filled 2018-02-27: qty 100

## 2018-02-27 MED ORDER — HYDROCOD POLST-CPM POLST ER 10-8 MG/5ML PO SUER: 5.0000 mL | Freq: Two times a day (BID) | ORAL | Status: DC | PRN

## 2018-02-27 MED ORDER — HEPARIN BOLUS VIA INFUSION
3000.0000 [IU] | Freq: Once | INTRAVENOUS | Status: AC
  Administered 2018-02-27: 3000 [IU] via INTRAVENOUS
  Filled 2018-02-27: qty 3000

## 2018-02-27 MED ORDER — SENNOSIDES-DOCUSATE SODIUM 8.6-50 MG PO TABS
1.0000 | ORAL_TABLET | Freq: Two times a day (BID) | ORAL | Status: DC
  Administered 2018-02-27 – 2018-03-02 (×6): 1 via ORAL
  Filled 2018-02-27 (×6): qty 1

## 2018-02-27 MED ORDER — MORPHINE SULFATE (PF) 2 MG/ML IV SOLN: 1.0000 mg | INTRAVENOUS | Status: DC | PRN

## 2018-02-27 MED ORDER — SODIUM CHLORIDE 0.9 % IV SOLN
250.0000 mL | INTRAVENOUS | Status: DC | PRN
  Administered 2018-02-27: 250 mL via INTRAVENOUS

## 2018-02-27 MED ORDER — PANTOPRAZOLE SODIUM 40 MG PO TBEC
40.0000 mg | DELAYED_RELEASE_TABLET | Freq: Every day | ORAL | Status: DC
  Administered 2018-02-27 – 2018-03-01 (×3): 40 mg via ORAL
  Filled 2018-02-27 (×3): qty 1

## 2018-02-27 MED ORDER — SODIUM CHLORIDE 0.9% FLUSH: 3.0000 mL | INTRAVENOUS | Status: DC | PRN

## 2018-02-27 MED ORDER — HYDROMORPHONE HCL 1 MG/ML IJ SOLN
1.0000 mg | INTRAMUSCULAR | Status: DC | PRN
  Administered 2018-02-27: 1 mg via INTRAVENOUS
  Filled 2018-02-27 (×2): qty 1

## 2018-02-27 MED ORDER — ONDANSETRON HCL 4 MG/2ML IJ SOLN
4.0000 mg | Freq: Four times a day (QID) | INTRAMUSCULAR | Status: DC | PRN
  Administered 2018-02-28: 4 mg via INTRAVENOUS
  Filled 2018-02-27: qty 2

## 2018-02-27 MED ORDER — SODIUM CHLORIDE 0.9% FLUSH
3.0000 mL | Freq: Two times a day (BID) | INTRAVENOUS | Status: DC
  Administered 2018-02-28 – 2018-03-01 (×2): 3 mL via INTRAVENOUS

## 2018-02-27 MED ORDER — PROCHLORPERAZINE MALEATE 10 MG PO TABS: 10.0000 mg | ORAL_TABLET | Freq: Four times a day (QID) | ORAL | Status: DC | PRN

## 2018-02-27 MED ORDER — IOPAMIDOL (ISOVUE-370) INJECTION 76%
100.0000 mL | Freq: Once | INTRAVENOUS | Status: AC | PRN
  Administered 2018-02-27: 100 mL via INTRAVENOUS

## 2018-02-27 MED ORDER — ZINC SULFATE 220 (50 ZN) MG PO CAPS
220.0000 mg | ORAL_CAPSULE | Freq: Every day | ORAL | Status: DC
  Administered 2018-02-28 – 2018-03-02 (×3): 220 mg via ORAL
  Filled 2018-02-27 (×3): qty 1

## 2018-02-27 MED ORDER — ALBUTEROL SULFATE (2.5 MG/3ML) 0.083% IN NEBU: 2.5000 mg | INHALATION_SOLUTION | Freq: Four times a day (QID) | RESPIRATORY_TRACT | Status: DC | PRN

## 2018-02-27 MED ORDER — ONDANSETRON HCL 4 MG PO TABS: 4.0000 mg | ORAL_TABLET | Freq: Four times a day (QID) | ORAL | Status: DC | PRN

## 2018-02-27 MED ORDER — POLYETHYLENE GLYCOL 3350 17 G PO PACK
17.0000 g | PACK | Freq: Every day | ORAL | Status: DC
  Administered 2018-02-28 – 2018-03-02 (×3): 17 g via ORAL
  Filled 2018-02-27 (×3): qty 1

## 2018-02-27 MED ORDER — ACETAMINOPHEN 325 MG PO TABS: 650.0000 mg | ORAL_TABLET | Freq: Four times a day (QID) | ORAL | Status: DC | PRN

## 2018-02-27 MED ORDER — OXYCODONE-ACETAMINOPHEN 5-325 MG PO TABS: 1.0000 | ORAL_TABLET | Freq: Three times a day (TID) | ORAL | Status: DC | PRN

## 2018-02-27 NOTE — Consult Note (Signed)
.  Cardiology Consultation:   Patient ID: Rhonda Meyers MRN: [id number]; DOB: Jan 07, 1961  Admit date: 02/27/2018 Date of Consult: 02/27/2018  Primary Care Provider: Patient, No Pcp Per Primary Cardiologist: No primary care provider on file.  Ether Griffins Primary Electrophysiologist:  None    Patient Profile:   Rhonda Meyers is a 57 y.o. female with a hx of metastatic lung cancer who is being seen today for the evaluation of atypical chest pain with elevated troponin at the request of Dr Ronnald Nian.  History of Present Illness:   Ms. Cardella is a 57 year old female with recently discovered stage IV metastatic non-small cell lung cancer currently on palliative chemotherapy who came into the emergency room with 2 days of atypical left upper side chest wall discomfort moving her left arm, worse with a deep breath.  A troponin was drawn and was elevated at 0.24.  No fevers chills nausea vomiting syncope bleeding.  No prior cardiac history.  A PE protocol CT was performed in the ER, negative for PE, however left-sided lung cancer lesions were noted especially left upper.  Family was present in room during discussion.  Past Medical History:  Diagnosis Date  . Cancer ([place])   . Dyspnea    WIth activity  . Hilar adenopathy 01/25/2018  . Lung mass 01/25/2018  . Mediastinal adenopathy  01/25/2018  . Tobacco abuse     Past Surgical History:  Procedure Laterality Date  . NO PAST SURGERIES    . VIDEO BRONCHOSCOPY WITH ENDOBRONCHIAL ULTRASOUND N/A 01/28/2018   Procedure: VIDEO BRONCHOSCOPY WITH ENDOBRONCHIAL ULTRASOUND;  Surgeon: Garner Nash, DO;  Location: [facility] OR;  Service: Thoracic;  Laterality: N/A;     Home Medications:  Prior to Admission medications   Medication Sig Start Date End Date Taking? Authorizing Provider  albuterol (PROVENTIL HFA;VENTOLIN HFA) 108 (90 Base) MCG/ACT inhaler Inhale 1-2 puffs into the lungs every 6 (six) hours as needed for wheezing or shortness  of breath.    [provider]  albuterol (PROVENTIL) (2.5 MG/3ML) 0.083% nebulizer solution Take 3 mLs (2.5 mg total) by nebulization every 6 (six) hours as needed for wheezing or shortness of breath. 01/28/18   June Leap L, DO  amoxicillin-clavulanate (AUGMENTIN) 875-125 MG tablet Take 1 tablet by mouth 2 (two) times daily. 02/23/18   Libby Maw, MD  chlorpheniramine-HYDROcodone (TUSSIONEX) 10-8 MG/5ML SUER Take 5 mLs by mouth every 12 (twelve) hours as needed for cough. 02/20/18   Kyung Rudd, MD  ELIQUIS STARTER PACK [facility] STARTER PACK) 5 MG TABS Take as directed on package: start with two-5mg  tablets twice daily for 7 days. On day 8, switch to one-5mg  tablet twice daily. 02/10/18   Mariel Aloe, MD  folic acid (FOLVITE) 1 MG tablet Take 1 tablet (1 mg total) by mouth daily. 02/25/18   Curt Bears, MD  OVER THE COUNTER MEDICATION Take 1 capsule by mouth daily. Supplement containing "black seed", garlic, olive leaf, ginger, cayenne.    [provider]  oxyCODONE-acetaminophen (PERCOCET) 5-325 MG tablet Take 1 tablet by mouth every 8 (eight) hours as needed for severe pain. 02/23/18 02/23/19  Libby Maw, MD  polyethylene glycol [facility] / Floria Raveling) packet Take 17 g by mouth daily. 02/11/18   Mariel Aloe, MD  prochlorperazine (COMPAZINE) 10 MG tablet Take 1 tablet (10 mg total) by mouth every 6 (six) hours as needed for nausea or vomiting. 02/25/18   Curt Bears, MD  senna-docusate (SENOKOT-S) 8.6-50 MG tablet Take 1 tablet by mouth  2 (two) times daily. 02/10/18   Mariel Aloe, MD  Zinc 50 MG CAPS Take 50 mg by mouth daily.    [provider]    Inpatient Medications: Scheduled Meds: . iopamidol      . sodium chloride       Continuous Infusions: . heparin 800 Units/hr (02/27/18 1622)   PRN Meds: HYDROmorphone (DILAUDID) injection  Allergies:   No Known Allergies  Social History:   Social History   Socioeconomic  History  . Marital status: Single    Spouse name: Not on file  . Number of children: Not on file  . Years of education: Not on file  . Highest education level: Not on file  Occupational History  . Not on file  Social Needs  . Financial resource strain: Not on file  . Food insecurity:    Worry: Not on file    Inability: Not on file  . Transportation needs:    Medical: No    Non-medical: No  Tobacco Use  . Smoking status: Former Smoker    Packs/day: 0.50    Years: 30.00    Pack years: 15.00    Types: Cigarettes    Start date: 05/06/1981    Last attempt to quit: 01/17/2018    Years since quitting: 0.1  . Smokeless tobacco: Never Used  Substance and Sexual Activity  . Alcohol use: Not Currently    Comment: 1 bottle wine on weekends  . Drug use: No  . Sexual activity: Not on file  Lifestyle  . Physical activity:    Days per week: Not on file    Minutes per session: Not on file  . Stress: Not on file  Relationships  . Social connections:    Talks on phone: Not on file    Gets together: Not on file    Attends religious service: Not on file    Active member of club or organization: Not on file    Attends meetings of clubs or organizations: Not on file    Relationship status: Not on file  . Intimate partner violence:    Fear of current or ex partner: Not on file    Emotionally abused: Not on file    Physically abused: Not on file    Forced sexual activity: Not on file  Other Topics Concern  . Not on file  Social History Narrative   Lives alone, no pets   Occupation: housekeeper at [employer]   Activity: regular walking   Diet: good water, fruit/svegetables daily    Family History:    Family History  Problem Relation Age of Onset  . Cancer Maternal Grandmother 70       breast  . Rheumatic fever Mother   . CAD Mother 57       MI  . Hypertension Sister   . Diabetes Neg Hx   . Stroke Neg Hx      ROS:  Please see the history of present illness.   All other ROS  reviewed and negative.     Physical Exam/Data:   Vitals:   02/27/18 1245 02/27/18 1430  BP: 107/86 108/86  Pulse: (!) 110 (!) 105  Resp: 18   Temp: 98.4 F (36.9 C)   TempSrc: Oral   SpO2: 100% 95%    Intake/Output Summary (Last 24 hours) at 02/27/2018 1630 Last data filed at 02/27/2018 1444 Gross per 24 hour  Intake 500 ml  Output -  Net 500 ml   There  were no vitals filed for this visit. There is no height or weight on file to calculate BMI.  General:  Well nourished, well developed, in no acute distress, mildly ill-appearing HEENT: normal Lymph: no adenopathy Neck: no JVD Endocrine:  No thryomegaly Vascular: No carotid bruits; FA pulses 2+ bilaterally without bruits  Cardiac:  normal S1, S2; tachycardic regular; no murmur  Lungs:  clear to auscultation bilaterally, no wheezing, rhonchi or rales  Abd: soft, nontender, no hepatomegaly  Ext: no edema Musculoskeletal:  No deformities, BUE and BLE strength normal and equal Skin: warm and dry  Neuro:  CNs 2-12 intact, no focal abnormalities noted Psych:  Normal affect   EKG:  The EKG was personally reviewed and demonstrates: Sinus rhythm/sinus tachycardia with no ischemic changes  Telemetry:  Telemetry was personally reviewed and demonstrates: Sinus rhythm/sinus tachycardia  Relevant CV Studies: EKG as above unremarkable  Laboratory Data:  Chemistry Recent Labs  Lab 02/27/18 1327  NA 137  K 3.7  CL 100  CO2 22  GLUCOSE 99  BUN 19  CREATININE 0.84  CALCIUM 9.3  GFRNONAA >60  GFRAA >60  ANIONGAP 15    No results for input(s): PROT, ALBUMIN, AST, ALT, ALKPHOS, BILITOT in the last 168 hours. Hematology Recent Labs  Lab 02/27/18 1327  WBC 14.2*  RBC 5.50*  HGB 14.7  HCT 46.7*  MCV 84.9  MCH 26.7  MCHC 31.5  RDW 14.3  PLT 200   Cardiac Enzymes Recent Labs  Lab 02/27/18 1327  TROPONINI 0.24*   No results for input(s): TROPIPOC in the last 168 hours.  BNPNo results for input(s): BNP, PROBNP in  the last 168 hours.  DDimer No results for input(s): DDIMER in the last 168 hours.  Radiology/Studies:  Dg Chest 2 View  Result Date: 02/27/2018 CLINICAL DATA:  Chest pain; hx lung ca; former smoker EXAM: CHEST - 2 VIEW COMPARISON:  Chest CT on 02/09/2018 CT chest x-ray on 02/09/2018 FINDINGS: There has been significant improvement in aeration of the LEFT lung. The LEFT hilar region is enlarged, consistent with known malignancy. There is no pulmonary edema. Small RIGHT pleural effusion or pleural thickening noted. Visualized osseous structures have a normal appearance. IMPRESSION: Significantly improved aeration in the LEFT lung. LEFT hilar mass. Electronically Signed   By: Nolon Nations M.D.   On: 02/27/2018 13:55   Ct Angio Chest Pe W And/or Wo Contrast  Result Date: 02/27/2018 CLINICAL DATA:  Chest pain.  History of lung cancer. EXAM: CT ANGIOGRAPHY CHEST WITH CONTRAST TECHNIQUE: Multidetector CT imaging of the chest was performed using the standard protocol during bolus administration of intravenous contrast. Multiplanar CT image reconstructions and MIPs were obtained to evaluate the vascular anatomy. CONTRAST:  167mL ISOVUE-370 IOPAMIDOL (ISOVUE-370) INJECTION 76% COMPARISON:  CT scan of February 09, 2018. FINDINGS: Cardiovascular: Satisfactory opacification of the pulmonary arteries to the segmental level. No evidence of pulmonary embolism. Normal heart size. No pericardial effusion. Nonocclusive embolus seen in lower lobe branch of right pulmonary artery on prior exam is no longer visualized. Mediastinum/Nodes: Thyroid gland and esophagus are unremarkable. 3.7 cm subcarinal lymph node is noted which is slightly decreased compared to prior exam. 15 mm left hilar lymph node is noted. Lungs/Pleura: No pneumothorax or pleural effusion is noted. 3.9 x 2.7 cm mass is noted in the superior segment of left lower lobe consistent with malignancy. 16 mm nodule is noted posteriorly in left upper lobe  consistent with metastatic disease. Mild left basilar atelectasis is noted. Minimal right  posterior basilar subsegmental atelectasis is noted. 19 x 15 mm cavitary lesion is noted in left upper lobe concerning for malignancy. Upper Abdomen: Stable right adrenal nodule is noted. No other abnormality seen in visualized portion of upper abdomen. Musculoskeletal: No chest wall abnormality. No acute or significant osseous findings. Review of the MIP images confirms the above findings. IMPRESSION: No definite evidence of pulmonary embolus is noted currently. Nonocclusive thrombus seen in lower lobe branch of right pulmonary artery on prior exam is no longer visualized. 3.7 cm subcarinal lymph node is noted which is slightly decreased in size compared to prior exam. 1.5 cm left hilar lymph node is noted which could not be visualized on prior exam due to atelectasis and effusion. Left pleural effusion noted on prior exam has resolved. 3.9 x 2.7 cm mass is noted in the superior segment of left lower lobe consistent with malignancy. 16 mm nodule is noted posteriorly in left upper lobe consistent with metastatic disease. Also noted is 19 mm cavitary lesion in left upper lobe concerning for metastatic disease. Stable right adrenal nodule. Electronically Signed   By: Marijo Conception, M.D.   On: 02/27/2018 15:39    Assessment and Plan:   Elevated troponin in the setting of atypical chest pain and metastatic lung cancer with left upper chest lesion - Had discussion with family that this may be underlying demand ischemia in the setting of her underlying metastatic lung cancer.  Certainly her constellation of symptoms are more musculoskeletal/likely related to her underlying lung masses.  Her mildly elevated troponin will be continued to be cycled.  It is not unreasonable for the next 12 to 24 hours to place her on IV heparin, watching for any signs of bleeding.  I would not proceed with any further aggressive invasive  therapies.  No cardiac catheterization.  She and her family agree with this.  As as tolerated, it would not be unreasonable to utilize low-level beta-blocker such as metoprolol 12.5 mg twice a day.  Aspirin 81 mg would not be unreasonable as well for the short-term however bleeding risks need to be weighed.  Statin with her underlying metastatic lung cancer would likely be of low utility. -Ultimately her current therapy should be geared towards resolution of her chest discomfort from her underlying lung cancer.  She and her family agree. - Continue to monitor here at [facility].  I do not feel strongly that an echocardiogram is necessary.  Metastatic lung cancer -Dr. Earlie Server note from 02/25/2018 has been reviewed.      For questions or updates, please contact [place] Please consult [email or web address] for contact info under     Signed, Candee Furbish, MD  02/27/2018 4:30 PM

## 2018-02-27 NOTE — ED Provider Notes (Signed)
Medical screening examination/treatment/procedure(s) were conducted as a shared visit with non-physician practitioner(s) and myself.  I personally evaluated the patient during the encounter. Briefly, the patient is a 57 y.o. female with history of lung cancer status post radiation who presents to the ED with chest pain, back pain.  Patient with tachycardia upon arrival but otherwise normal vitals.  No fever.  Patient with left-sided chest wall pain for the last 2 days.  Completed radiation therapy this past week.  She is set to begin chemotherapy.  She denies any cough, sputum production.  Pain is worse with movement.  Denies any cardiac history.  Patient is a former smoker.  Denies any significant family history of heart disease.  Denies any history of PE or DVT.  Denies any leg swelling.  Patient EKG does show sinus tachycardia.  She is clear breath sounds bilaterally.  No signs of volume overload on exam.  She is tender over the left side of her chest wall and with movement of her left arm.  She does not have a rash.  She has mild leukocytosis but otherwise no significant electrolyte abnormality, anemia.  Troponin was elevated.  Contacted cardiology and they recommend starting IV heparin as possibly could be ischemic.  Could be secondary to radiation therapy in the area of her heart as well.  PE study was ordered to rule out PE and other intra-thoracic issues.  Chest x-ray showed no signs of pneumonia, no pneumothorax, pleural effusion.    CT scan of chest with no PE, no acute findings, will be transferred over to [facility] for further cardiac work-up.  Patient hemodynamically stable throughout my care.  Concern for ACS versus myocarditis.   This chart was dictated using voice recognition software.  Despite best efforts to proofread,  errors can occur which can change the documentation meaning.    EKG Interpretation  Date/Time:  Friday February 27 2018 14:51:47 EDT Ventricular Rate:  102 PR Interval:     QRS Duration: 99 QT Interval:  368 QTC Calculation: 480 R Axis:   4 Text Interpretation:  Sinus tachycardia Confirmed by Lennice Sites [phone]) on 02/27/2018 3:35:19 PM          Lennice Sites, DO 02/27/18 1546

## 2018-02-27 NOTE — ED Notes (Signed)
ED TO INPATIENT HANDOFF REPORT  Name/Age/Gender Rhonda Meyers 57 y.o. female  Code Status Code Status History    Date Active Date Inactive Code Status Order ID Comments User Context   02/09/2018 1236 02/10/2018 2036 Full Code [id number]  Lavina Hamman, MD ED      Home/SNF/Other Home  Chief Complaint chest/shoulder pain  Level of Care/Admitting Diagnosis ED Disposition    ED Disposition Condition [facility] Area: [facility] [[id number]]  Level of Care: Telemetry [5]  Admit to tele based on following criteria: Other see comments  Comments: high tropinin  Diagnosis: Chest pain [937902]  Admitting Physician: Tawni Millers [[id number]]  Attending Physician: Tawni Millers [[id number]]  PT Class (Do Not Modify): Observation [104]  PT Acc Code (Do Not Modify): Observation [10022]       Medical History Past Medical History:  Diagnosis Date  . Cancer ([place])   . Dyspnea    WIth activity  . Hilar adenopathy 01/25/2018  . Lung mass 01/25/2018  . Mediastinal adenopathy  01/25/2018  . Tobacco abuse     Allergies No Known Allergies  IV Location/Drains/Wounds Patient Lines/Drains/Airways Status   Active Line/Drains/Airways    Name:   Placement date:   Placement time:   Site:   Days:   Peripheral IV 02/27/18 Right Antecubital   02/27/18    1327    Antecubital   less than 1          Labs/Imaging Results for orders placed or performed during the hospital encounter of 02/27/18 (from the past 48 hour(s))  CBC with Differential/Platelet     Status: Abnormal   Collection Time: 02/27/18  1:27 PM  Result Value Ref Range   WBC 14.2 (H) 4.0 - 10.5 K/uL   RBC 5.50 (H) 3.87 - 5.11 MIL/uL   Hemoglobin 14.7 12.0 - 15.0 g/dL   HCT 46.7 (H) 36.0 - 46.0 %   MCV 84.9 80.0 - 100.0 fL   MCH 26.7 26.0 - 34.0 pg   MCHC 31.5 30.0 - 36.0 g/dL   RDW 14.3 11.5 - 15.5 %   Platelets 200 150 - 400 K/uL   nRBC 0.0 0.0 - 0.2 %   Neutrophils  Relative % 85 %   Neutro Abs 12.2 (H) 1.7 - 7.7 K/uL   Lymphocytes Relative 3 %   Lymphs Abs 0.4 (L) 0.7 - 4.0 K/uL   Monocytes Relative 10 %   Monocytes Absolute 1.4 (H) 0.1 - 1.0 K/uL   Eosinophils Relative 1 %   Eosinophils Absolute 0.2 0.0 - 0.5 K/uL   Basophils Relative 0 %   Basophils Absolute 0.0 0.0 - 0.1 K/uL   Immature Granulocytes 1 %   Abs Immature Granulocytes 0.07 0.00 - 0.07 K/uL    Comment: Performed at [facility], [place] [address], [place],  [address]  Basic metabolic panel     Status: None   Collection Time: 02/27/18  1:27 PM  Result Value Ref Range   Sodium 137 135 - 145 mmol/L   Potassium 3.7 3.5 - 5.1 mmol/L   Chloride 100 98 - 111 mmol/L   CO2 22 22 - 32 mmol/L   Glucose, Bld 99 70 - 99 mg/dL   BUN 19 6 - 20 mg/dL   Creatinine, Ser 0.84 0.44 - 1.00 mg/dL   Calcium 9.3 8.9 - 10.3 mg/dL   GFR calc non Af Amer >60 >60 mL/min   GFR calc Af Amer >60 >60  mL/min    Comment: (NOTE) The eGFR has been calculated using the CKD EPI equation. This calculation has not been validated in all clinical situations. eGFR's persistently <60 mL/min signify possible Chronic Kidney Disease.    Anion gap 15 5 - 15    Comment: Performed at [facility], [place] [address], [place], [place] [address]  Troponin I     Status: Abnormal   Collection Time: 02/27/18  1:27 PM  Result Value Ref Range   Troponin I 0.24 (HH) <0.03 ng/mL    Comment: CRITICAL RESULT CALLED TO, READ BACK BY AND VERIFIED WITHLarey Days [id number] @ 4008 BY J SCOTTON Performed at [facility] [address] [place], [place] [address]    Dg Chest 2 View  Result Date: 02/27/2018 CLINICAL DATA:  Chest pain; hx lung ca; former smoker EXAM: CHEST - 2 VIEW COMPARISON:  Chest CT on 02/09/2018 CT chest x-ray on 02/09/2018 FINDINGS: There has been significant improvement in aeration of the LEFT lung. The LEFT hilar region is enlarged, consistent with  known malignancy. There is no pulmonary edema. Small RIGHT pleural effusion or pleural thickening noted. Visualized osseous structures have a normal appearance. IMPRESSION: Significantly improved aeration in the LEFT lung. LEFT hilar mass. Electronically Signed   By: Nolon Nations M.D.   On: 02/27/2018 13:55   Ct Angio Chest Pe W And/or Wo Contrast  Result Date: 02/27/2018 CLINICAL DATA:  Chest pain.  History of lung cancer. EXAM: CT ANGIOGRAPHY CHEST WITH CONTRAST TECHNIQUE: Multidetector CT imaging of the chest was performed using the standard protocol during bolus administration of intravenous contrast. Multiplanar CT image reconstructions and MIPs were obtained to evaluate the vascular anatomy. CONTRAST:  157m ISOVUE-370 IOPAMIDOL (ISOVUE-370) INJECTION 76% COMPARISON:  CT scan of February 09, 2018. FINDINGS: Cardiovascular: Satisfactory opacification of the pulmonary arteries to the segmental level. No evidence of pulmonary embolism. Normal heart size. No pericardial effusion. Nonocclusive embolus seen in lower lobe branch of right pulmonary artery on prior exam is no longer visualized. Mediastinum/Nodes: Thyroid gland and esophagus are unremarkable. 3.7 cm subcarinal lymph node is noted which is slightly decreased compared to prior exam. 15 mm left hilar lymph node is noted. Lungs/Pleura: No pneumothorax or pleural effusion is noted. 3.9 x 2.7 cm mass is noted in the superior segment of left lower lobe consistent with malignancy. 16 mm nodule is noted posteriorly in left upper lobe consistent with metastatic disease. Mild left basilar atelectasis is noted. Minimal right posterior basilar subsegmental atelectasis is noted. 19 x 15 mm cavitary lesion is noted in left upper lobe concerning for malignancy. Upper Abdomen: Stable right adrenal nodule is noted. No other abnormality seen in visualized portion of upper abdomen. Musculoskeletal: No chest wall abnormality. No acute or significant osseous findings.  Review of the MIP images confirms the above findings. IMPRESSION: No definite evidence of pulmonary embolus is noted currently. Nonocclusive thrombus seen in lower lobe branch of right pulmonary artery on prior exam is no longer visualized. 3.7 cm subcarinal lymph node is noted which is slightly decreased in size compared to prior exam. 1.5 cm left hilar lymph node is noted which could not be visualized on prior exam due to atelectasis and effusion. Left pleural effusion noted on prior exam has resolved. 3.9 x 2.7 cm mass is noted in the superior segment of left lower lobe consistent with malignancy. 16 mm nodule is noted posteriorly in left upper lobe consistent with metastatic disease. Also noted is 19 mm  cavitary lesion in left upper lobe concerning for metastatic disease. Stable right adrenal nodule. Electronically Signed   By: Marijo Conception, M.D.   On: 02/27/2018 15:39   EKG Interpretation  Date/Time:  Friday February 27 2018 14:51:47 EDT Ventricular Rate:  102 PR Interval:    QRS Duration: 99 QT Interval:  368 QTC Calculation: 480 R Axis:   4 Text Interpretation:  Sinus tachycardia Confirmed by Lennice Sites [phone]) on 02/27/2018 3:35:19 PM   Pending Labs Unresulted Labs (From admission, onward)    Start     Ordered   Signed and Occupational hygienist morning,   R     Signed and Held   Signed and Held  CBC  Tomorrow morning,   R     Signed and Held          Vitals/Pain Today's Vitals   02/27/18 1241 02/27/18 1245 02/27/18 1430  BP:  107/86 108/86  Pulse:  (!) 110 (!) 105  Resp:  18   Temp:  98.4 F (36.9 C)   TempSrc:  Oral   SpO2:  100% 95%  PainSc: 6       Isolation Precautions No active isolations  Medications Medications  HYDROmorphone (DILAUDID) injection 1 mg (1 mg Intravenous Given 02/27/18 1340)  iopamidol (ISOVUE-370) 76 % injection (has no administration in time range)  sodium chloride 0.9 % injection (has no administration in time range)   heparin ADULT infusion 100 units/mL (25000 units/216m sodium chloride 0.45%) (800 Units/hr Intravenous New Bag/Given 02/27/18 1622)  ondansetron (ZOFRAN) injection 4 mg (4 mg Intravenous Given 02/27/18 1340)  sodium chloride 0.9 % bolus 500 mL (0 mLs Intravenous Stopped 02/27/18 1444)  iopamidol (ISOVUE-370) 76 % injection 100 mL (100 mLs Intravenous Contrast Given 02/27/18 1503)  heparin bolus via infusion 3,000 Units (3,000 Units Intravenous Bolus from Bag 02/27/18 1622)    Mobility walks with person assist

## 2018-02-27 NOTE — Progress Notes (Addendum)
ANTICOAGULATION CONSULT NOTE  Pharmacy Consult for heparin Indication: r/o ACS No Known Allergies  Patient Measurements:   Heparin Dosing Weight: 63.4 kg  Vital Signs: Temp: 98.4 F (36.9 C) (10/25 1245) Temp Source: Oral (10/25 1245) BP: 108/86 (10/25 1430) Pulse Rate: 105 (10/25 1430)  Labs: Recent Labs    02/27/18 1327  HGB 14.7  HCT 46.7*  PLT 200  CREATININE 0.84  TROPONINI 0.24*    Estimated Creatinine Clearance: 67.1 mL/min (by C-G formula based on SCr of 0.84 mg/dL).   Medical History: Past Medical History:  Diagnosis Date  . Cancer ([place])   . Dyspnea    WIth activity  . Hilar adenopathy 01/25/2018  . Lung mass 01/25/2018  . Mediastinal adenopathy  01/25/2018  . Tobacco abuse     Medications:   (Not in a hospital admission) Scheduled:  . iopamidol      . sodium chloride       PRN: HYDROmorphone (DILAUDID) injection  Assessment: 89 yoF with Hx metastatic NSCLC on chemo and currently on Eliquis for recent PE found 10/7, admitted for atypical chest pain x2d with elevated troponins. CTA negative for new PE and shows resolution of original clot, but concern for possible ischemic cardiac dz. No plans for cardiac cath, but Cards did recommend IV heparin for the next 12-24 hr.   Baseline INR, aPTT: not done this admit; normal on 10/7  Prior anticoagulation: Eliquis 10 mg bid; last dose 10/24 at 6pm; was to begin 5 mg bid today 10/25  Significant events:  Today, 02/27/2018:  CBC: WNL  No bleeding or infusion issues per nursing  CrCl: 67 ml/min  ~20 hrs since last dose of Eliquis  Goal of Therapy: Heparin level 0.3-0.7 units/ml Monitor platelets by anticoagulation protocol: Yes  Plan:  Heparin 3000 units IV bolus x 1  Heparin 800 units/hr IV infusion  Check aPTT 6 hrs after start; daily heparin level and CBC  Monitor for signs of bleeding or thrombosis  F/u troponins, Cards recommendations for stopping heparin   Reuel Boom, PharmD,  BCPS [phone] 02/27/2018, 4:44 PM

## 2018-02-27 NOTE — ED Provider Notes (Signed)
[facility] DEPT Provider Note   CSN: [id number] Arrival date & time: 02/27/18  1231     History   Chief Complaint Chief Complaint  Patient presents with  . Chest Pain  . Back Pain    HPI Rhonda Meyers is a 57 y.o. female.  The history is provided by the patient and medical records. No language interpreter was used.  Chest Pain   Associated symptoms include back pain.  Back Pain   Associated symptoms include chest pain.     57 year old female with history of malignant neoplasms of left lung stage IV presenting for evaluation of chest and back pain.  Patient reports she was diagnosed with lung cancer approximately 6 months ago and has been going through radiation treatment.  She recently finished with her 11th ideation treatment 5 days ago.  For the past 3 to 4 days she has had progressive worsening pain to her left upper chest radiates to her back.  Pain is sharp, shooting, worsening with movement.  No associated fever, chills, lightheadedness, dizziness, nausea, diaphoresis, shortness of breath, productive cough, hemoptysis.  She has been taking her home pain medication including oxycodone as well as taking cough syrup and robotic prescribed by her PCP but report no adequate improvement.  Her oncologist is Dr. Earlie Server.  She is a former smoker.  Past Medical History:  Diagnosis Date  . Cancer ([place])   . Dyspnea    WIth activity  . Hilar adenopathy 01/25/2018  . Lung mass 01/25/2018  . Mediastinal adenopathy  01/25/2018  . Tobacco abuse     Patient Active Problem List   Diagnosis Date Noted  . Encounter for antineoplastic chemotherapy 02/25/2018  . Encounter for antineoplastic immunotherapy 02/25/2018  . Goals of care, counseling/discussion 02/25/2018  . Bronchitis 02/23/2018  . Adenocarcinoma ([place]) 02/23/2018  . Malignant neoplasm of left lung stage 4 ([place]) 02/23/2018  . Pulmonary embolism ([place]) 02/09/2018  . Cancer related pain  02/09/2018  . Adenocarcinoma of left lung, stage 4 ([place]) 02/05/2018  . Encounter for smoking cessation counseling 01/25/2018  . Lung mass 01/25/2018  . Mediastinal adenopathy  01/25/2018  . Hilar adenopathy 01/25/2018  . Tobacco abuse 11/15/2013  . Other nonspecific abnormal finding of lung field 11/15/2013  . Perimenopause 11/15/2013    Past Surgical History:  Procedure Laterality Date  . NO PAST SURGERIES    . VIDEO BRONCHOSCOPY WITH ENDOBRONCHIAL ULTRASOUND N/A 01/28/2018   Procedure: VIDEO BRONCHOSCOPY WITH ENDOBRONCHIAL ULTRASOUND;  Surgeon: Garner Nash, DO;  Location: [facility] OR;  Service: Thoracic;  Laterality: N/A;     OB History   None      Home Medications    Prior to Admission medications   Medication Sig Start Date End Date Taking? Authorizing Provider  albuterol (PROVENTIL HFA;VENTOLIN HFA) 108 (90 Base) MCG/ACT inhaler Inhale 1-2 puffs into the lungs every 6 (six) hours as needed for wheezing or shortness of breath.    [provider]  albuterol (PROVENTIL) (2.5 MG/3ML) 0.083% nebulizer solution Take 3 mLs (2.5 mg total) by nebulization every 6 (six) hours as needed for wheezing or shortness of breath. 01/28/18   June Leap L, DO  amoxicillin-clavulanate (AUGMENTIN) 875-125 MG tablet Take 1 tablet by mouth 2 (two) times daily. 02/23/18   Libby Maw, MD  chlorpheniramine-HYDROcodone (TUSSIONEX) 10-8 MG/5ML SUER Take 5 mLs by mouth every 12 (twelve) hours as needed for cough. 02/20/18   Kyung Rudd, MD  ELIQUIS STARTER PACK (ELIQUIS STARTER PACK) 5 MG TABS  Take as directed on package: start with two-5mg  tablets twice daily for 7 days. On day 8, switch to one-5mg  tablet twice daily. 02/10/18   Mariel Aloe, MD  folic acid (FOLVITE) 1 MG tablet Take 1 tablet (1 mg total) by mouth daily. 02/25/18   Curt Bears, MD  OVER THE COUNTER MEDICATION Take 1 capsule by mouth daily. Supplement containing "black seed", garlic, olive leaf, ginger, cayenne.     [provider]  oxyCODONE-acetaminophen (PERCOCET) 5-325 MG tablet Take 1 tablet by mouth every 8 (eight) hours as needed for severe pain. 02/23/18 02/23/19  Libby Maw, MD  polyethylene glycol [facility] Floria Raveling) packet Take 17 g by mouth daily. 02/11/18   Mariel Aloe, MD  prochlorperazine (COMPAZINE) 10 MG tablet Take 1 tablet (10 mg total) by mouth every 6 (six) hours as needed for nausea or vomiting. 02/25/18   Curt Bears, MD  senna-docusate (SENOKOT-S) 8.6-50 MG tablet Take 1 tablet by mouth 2 (two) times daily. 02/10/18   Mariel Aloe, MD  Zinc 50 MG CAPS Take 50 mg by mouth daily.    [provider]    Family History Family History  Problem Relation Age of Onset  . Cancer Maternal Grandmother 32       breast  . Rheumatic fever Mother   . CAD Mother 73       MI  . Hypertension Sister   . Diabetes Neg Hx   . Stroke Neg Hx     Social History Social History   Tobacco Use  . Smoking status: Former Smoker    Packs/day: 0.50    Years: 30.00    Pack years: 15.00    Types: Cigarettes    Start date: 05/06/1981    Last attempt to quit: 01/17/2018    Years since quitting: 0.1  . Smokeless tobacco: Never Used  Substance Use Topics  . Alcohol use: Not Currently    Comment: 1 bottle wine on weekends  . Drug use: No     Allergies   Patient has no known allergies.   Review of Systems Review of Systems  Cardiovascular: Positive for chest pain.  Musculoskeletal: Positive for back pain.  All other systems reviewed and are negative.    Physical Exam Updated Vital Signs BP 107/86 (BP Location: Right Arm)   Pulse (!) 110   Temp 98.4 F (36.9 C) (Oral)   Resp 18   LMP 09/03/2013   SpO2 100%   Physical Exam  Constitutional: She appears well-developed and well-nourished. No distress.  HENT:  Head: Atraumatic.  Eyes: Conjunctivae are normal.  Neck: Neck supple.  Cardiovascular: Normal rate, regular rhythm, intact distal  pulses and normal pulses.  Pulmonary/Chest: Effort normal and breath sounds normal. She exhibits tenderness (Tenderness to left upper chest wall on palpation without any crepitus step-off or overlying skin changes.).  Abdominal: Soft. She exhibits no distension. There is no tenderness.  Musculoskeletal: She exhibits no edema.  Neurological: She is alert.  Skin: No rash noted.  Psychiatric: She has a normal mood and affect.  Nursing note and vitals reviewed.    ED Treatments / Results  Labs (all labs ordered are listed, but only abnormal results are displayed) Labs Reviewed  CBC WITH DIFFERENTIAL/PLATELET - Abnormal; Notable for the following components:      Result Value   WBC 14.2 (*)    RBC 5.50 (*)    HCT 46.7 (*)    Neutro Abs 12.2 (*)  Lymphs Abs 0.4 (*)    Monocytes Absolute 1.4 (*)    All other components within normal limits  TROPONIN I - Abnormal; Notable for the following components:   Troponin I 0.24 (*)    All other components within normal limits  BASIC METABOLIC PANEL    EKG None   Date: 02/27/2018  Rate: 102  Rhythm: normal sinus rhythm  QRS Axis: normal  Intervals: normal  ST/T Wave abnormalities: normal  Conduction Disutrbances: none  Narrative Interpretation:   Old EKG Reviewed: No significant changes noted     Radiology Dg Chest 2 View  Result Date: 02/27/2018 CLINICAL DATA:  Chest pain; hx lung ca; former smoker EXAM: CHEST - 2 VIEW COMPARISON:  Chest CT on 02/09/2018 CT chest x-ray on 02/09/2018 FINDINGS: There has been significant improvement in aeration of the LEFT lung. The LEFT hilar region is enlarged, consistent with known malignancy. There is no pulmonary edema. Small RIGHT pleural effusion or pleural thickening noted. Visualized osseous structures have a normal appearance. IMPRESSION: Significantly improved aeration in the LEFT lung. LEFT hilar mass. Electronically Signed   By: Nolon Nations M.D.   On: 02/27/2018 13:55   Ct Angio  Chest Pe W And/or Wo Contrast  Result Date: 02/27/2018 CLINICAL DATA:  Chest pain.  History of lung cancer. EXAM: CT ANGIOGRAPHY CHEST WITH CONTRAST TECHNIQUE: Multidetector CT imaging of the chest was performed using the standard protocol during bolus administration of intravenous contrast. Multiplanar CT image reconstructions and MIPs were obtained to evaluate the vascular anatomy. CONTRAST:  139mL ISOVUE-370 IOPAMIDOL (ISOVUE-370) INJECTION 76% COMPARISON:  CT scan of February 09, 2018. FINDINGS: Cardiovascular: Satisfactory opacification of the pulmonary arteries to the segmental level. No evidence of pulmonary embolism. Normal heart size. No pericardial effusion. Nonocclusive embolus seen in lower lobe branch of right pulmonary artery on prior exam is no longer visualized. Mediastinum/Nodes: Thyroid gland and esophagus are unremarkable. 3.7 cm subcarinal lymph node is noted which is slightly decreased compared to prior exam. 15 mm left hilar lymph node is noted. Lungs/Pleura: No pneumothorax or pleural effusion is noted. 3.9 x 2.7 cm mass is noted in the superior segment of left lower lobe consistent with malignancy. 16 mm nodule is noted posteriorly in left upper lobe consistent with metastatic disease. Mild left basilar atelectasis is noted. Minimal right posterior basilar subsegmental atelectasis is noted. 19 x 15 mm cavitary lesion is noted in left upper lobe concerning for malignancy. Upper Abdomen: Stable right adrenal nodule is noted. No other abnormality seen in visualized portion of upper abdomen. Musculoskeletal: No chest wall abnormality. No acute or significant osseous findings. Review of the MIP images confirms the above findings. IMPRESSION: No definite evidence of pulmonary embolus is noted currently. Nonocclusive thrombus seen in lower lobe branch of right pulmonary artery on prior exam is no longer visualized. 3.7 cm subcarinal lymph node is noted which is slightly decreased in size compared  to prior exam. 1.5 cm left hilar lymph node is noted which could not be visualized on prior exam due to atelectasis and effusion. Left pleural effusion noted on prior exam has resolved. 3.9 x 2.7 cm mass is noted in the superior segment of left lower lobe consistent with malignancy. 16 mm nodule is noted posteriorly in left upper lobe consistent with metastatic disease. Also noted is 19 mm cavitary lesion in left upper lobe concerning for metastatic disease. Stable right adrenal nodule. Electronically Signed   By: Marijo Conception, M.D.   On: 02/27/2018 15:39  Procedures .Critical Care Performed by: Domenic Moras, PA-C Authorized by: Domenic Moras, PA-C   Critical care provider statement:    Critical care time (minutes):  45   Critical care was time spent personally by me on the following activities:  Discussions with consultants, evaluation of patient's response to treatment, examination of patient, ordering and performing treatments and interventions, ordering and review of laboratory studies, ordering and review of radiographic studies, pulse oximetry, re-evaluation of patient's condition, obtaining history from patient or surrogate and review of old charts   (including critical care time)  Medications Ordered in ED Medications  HYDROmorphone (DILAUDID) injection 1 mg (1 mg Intravenous Given 02/27/18 1340)  iopamidol (ISOVUE-370) 76 % injection (has no administration in time range)  sodium chloride 0.9 % injection (has no administration in time range)  ondansetron (ZOFRAN) injection 4 mg (4 mg Intravenous Given 02/27/18 1340)  sodium chloride 0.9 % bolus 500 mL (0 mLs Intravenous Stopped 02/27/18 1444)  iopamidol (ISOVUE-370) 76 % injection 100 mL (100 mLs Intravenous Contrast Given 02/27/18 1503)     Initial Impression / Assessment and Plan / ED Course  I have reviewed the triage vital signs and the nursing notes.  Pertinent labs & imaging results that were available during my care of  the patient were reviewed by me and considered in my medical decision making (see chart for details).     BP 108/86   Pulse (!) 105   Temp 98.4 F (36.9 C) (Oral)   Resp 18   LMP 09/03/2013   SpO2 95%    Final Clinical Impressions(s) / ED Diagnoses   Final diagnoses:  None    ED Discharge Orders    None     1:14 PM Patient with known history of stage IV lung cancer here with complaints of pain to her left upper chest and back.  Pain similar to prior, likely cancer related.  Primary focus will be pain control.  Will perform screening labs EKG and chest x-ray.  2:50 PM Initial troponin is elevated at 0.24.  EKG without acute ischemic changes.  Pain in her chest is at the site of her radiation and it is reproducible likely muscle skeletal. However, I have consulted with cardiology and spoke with Tennis Must who will page cardiologist.   3:27 PM Appreciate consultation from cardiologist Dr. Gillian Shields who agrees with PE study, initiate heparin and also recommend hospital admission, transfer to [facility] and cardiology will be available for consultation.    4:01 PM Appreciate consultation from [facility] Dr. Cathlean Sauer who agrees to see and help with transfer to [facility] for further care.  Care discussed with DR. Curatolo.     Domenic Moras, PA-C 02/27/18 [place], [place], DO 02/27/18 2207

## 2018-02-27 NOTE — ED Triage Notes (Signed)
Pt dx with lung cancer with last radiation tx on Monday Now has shoulder, chest, back pain on palpation. Setting up chemo this week. Dr. Earlie Server is MD. [id number]

## 2018-02-27 NOTE — H&P (Signed)
History and Physical    Rhonda Meyers LFY:[id number] DOB: 10-Jun-1960 DOA: 02/27/2018  PCP: Patient, No Pcp Per   Patient coming from: Home   Chief Complaint: Chest pain  HPI: Rhonda Meyers is a 57 y.o. female with medical history significant of Stage IV (T3, N3, M1c) non-small cell lung cancer, adenocarcinoma localized on the left hilar region with mediastinal lymphadenopathy, metastasis to the liver, adrenal and bone, diagnosed 2019.  Patient reported left upper chest pain, that occurred while she was trying to get out of her bed, sharp in nature, 10 out of 10 in intensity, worse with left upper extremity movement, no improving factors, no radiation, no associated palpitations, diaphoresis or dyspnea.  At baseline she denies any angina, PND, orthopnea or lower extremity edema.  She concluded yesterday 11 treatments of radiation therapy to her left upper chest/palliative radiotherapy to the metastatic bone disease as well as obstructive left upper lobe lung mass.  Due to persistent pain (all morning) she came to the hospital for further evaluation.   ED Course: Initially evaluation showed unremarkable EKG, mildly elevated troponin, cardiology was consulted with recommendations to further observe patient in the hospital.  Review of Systems:  1. General: No fevers, no chills, no weight gain or weight loss 2. ENT: No runny nose or sore throat, no hearing disturbances 3. Pulmonary: No dyspnea, cough, wheezing, or hemoptysis 4. Cardiovascular: No angina, claudication, lower extremity edema, pnd or orthopnea 5. Gastrointestinal: No nausea or vomiting, no diarrhea or constipation 6. Hematology: No easy bruisability or frequent infections 7. Urology: No dysuria, hematuria or increased urinary frequency 8. Dermatology: No rashes. 9. Neurology: No seizures or paresthesias 10. Musculoskeletal: No joint pain or deformities  Past Medical History:  Diagnosis Date  . Cancer ([place])   .  Dyspnea    WIth activity  . Hilar adenopathy 01/25/2018  . Lung mass 01/25/2018  . Mediastinal adenopathy  01/25/2018  . Tobacco abuse     Past Surgical History:  Procedure Laterality Date  . NO PAST SURGERIES    . VIDEO BRONCHOSCOPY WITH ENDOBRONCHIAL ULTRASOUND N/A 01/28/2018   Procedure: VIDEO BRONCHOSCOPY WITH ENDOBRONCHIAL ULTRASOUND;  Surgeon: Garner Nash, DO;  Location: [place];  Service: Thoracic;  Laterality: N/A;     reports that she quit smoking about 5 weeks ago. Her smoking use included cigarettes. She started smoking about 36 years ago. She has a 15.00 pack-year smoking history. She has never used smokeless tobacco. She reports that she drank alcohol. She reports that she does not use drugs.  No Known Allergies  Family History  Problem Relation Age of Onset  . Cancer Maternal Grandmother 46       breast  . Rheumatic fever Mother   . CAD Mother 13       MI  . Hypertension Sister   . Diabetes Neg Hx   . Stroke Neg Hx      Prior to Admission medications   Medication Sig Start Date End Date Taking? Authorizing Provider  albuterol (PROVENTIL HFA;VENTOLIN HFA) 108 (90 Base) MCG/ACT inhaler Inhale 1-2 puffs into the lungs every 6 (six) hours as needed for wheezing or shortness of breath.    [provider]  albuterol (PROVENTIL) (2.5 MG/3ML) 0.083% nebulizer solution Take 3 mLs (2.5 mg total) by nebulization every 6 (six) hours as needed for wheezing or shortness of breath. 01/28/18   June Leap L, DO  amoxicillin-clavulanate (AUGMENTIN) 875-125 MG tablet Take 1 tablet by mouth 2 (two) times daily. 02/23/18  Libby Maw, MD  chlorpheniramine-HYDROcodone (TUSSIONEX) 10-8 MG/5ML SUER Take 5 mLs by mouth every 12 (twelve) hours as needed for cough. 02/20/18   Kyung Rudd, MD  ELIQUIS STARTER PACK [facility] STARTER PACK) 5 MG TABS Take as directed on package: start with two-5mg  tablets twice daily for 7 days. On day 8, switch to one-5mg  tablet twice  daily. 02/10/18   Mariel Aloe, MD  folic acid (FOLVITE) 1 MG tablet Take 1 tablet (1 mg total) by mouth daily. 02/25/18   Curt Bears, MD  OVER THE COUNTER MEDICATION Take 1 capsule by mouth daily. Supplement containing "black seed", garlic, olive leaf, ginger, cayenne.    [provider]  oxyCODONE-acetaminophen (PERCOCET) 5-325 MG tablet Take 1 tablet by mouth every 8 (eight) hours as needed for severe pain. 02/23/18 02/23/19  Libby Maw, MD  polyethylene glycol [facility]avel[facility]) packet Take 17 g by mouth daily. 02/11/18   Mariel Aloe, MD  prochlorperazine (COMPAZINE) 10 MG tablet Take 1 tablet (10 mg total) by mouth every 6 (six) hours as needed for nausea or vomiting. 02/25/18   Curt Bears, MD  senna-docusate (SENOKOT-S) 8.6-50 MG tablet Take 1 tablet by mouth 2 (two) times daily. 02/10/18   Mariel Aloe, MD  Zinc 50 MG CAPS Take 50 mg by mouth daily.    [provider]    Physical Exam: Vitals:   02/27/18 1245 02/27/18 1430  BP: 107/86 108/86  Pulse: (!) 110 (!) 105  Resp: 18   Temp: 98.4 F (36.9 C)   TempSrc: Oral   SpO2: 100% 95%    Vitals:   02/27/18 1245 02/27/18 1430  BP: 107/86 108/86  Pulse: (!) 110 (!) 105  Resp: 18   Temp: 98.4 F (36.9 C)   TempSrc: Oral   SpO2: 100% 95%   General: deconditioned  Neurology: Awake and alert, non focal Head and Neck. Head normocephalic. Neck supple with no adenopathy or thyromegaly.   E ENT: positive pallor, no icterus, oral mucosa moist Cardiovascular: No JVD. S1-S2 present, rhythmic, no gallops, rubs, or murmurs. No lower extremity edema. Pulmonary: positive breath sounds bilaterally, positive air movement,  wheezing, rhonchi or rales. Gastrointestinal. Abdomen with no organomegaly, non tender, no rebound or guarding Skin. No rashes Musculoskeletal: reproducible pain on palpation of her left upper chest, and passive movement of her left upper extremity.     Labs on  Admission: I have personally reviewed following labs and imaging studies  CBC: Recent Labs  Lab 02/27/18 1327  WBC 14.2*  NEUTROABS 12.2*  HGB 14.7  HCT 46.7*  MCV 84.9  PLT 810   Basic Metabolic Panel: Recent Labs  Lab 02/27/18 1327  NA 137  K 3.7  CL 100  CO2 22  GLUCOSE 99  BUN 19  CREATININE 0.84  CALCIUM 9.3   GFR: Estimated Creatinine Clearance: 67.1 mL/min (by C-G formula based on SCr of 0.84 mg/dL). Liver Function Tests: No results for input(s): AST, ALT, ALKPHOS, BILITOT, PROT, ALBUMIN in the last 168 hours. No results for input(s): LIPASE, AMYLASE in the last 168 hours. No results for input(s): AMMONIA in the last 168 hours. Coagulation Profile: No results for input(s): INR, PROTIME in the last 168 hours. Cardiac Enzymes: Recent Labs  Lab 02/27/18 1327  TROPONINI 0.24*   BNP (last 3 results) No results for input(s): PROBNP in the last 8760 hours. HbA1C: No results for input(s): HGBA1C in the last 72 hours. CBG: No results for input(s): GLUCAP in  the last 168 hours. Lipid Profile: No results for input(s): CHOL, HDL, LDLCALC, TRIG, CHOLHDL, LDLDIRECT in the last 72 hours. Thyroid Function Tests: No results for input(s): TSH, T4TOTAL, FREET4, T3FREE, THYROIDAB in the last 72 hours. Anemia Panel: No results for input(s): VITAMINB12, FOLATE, FERRITIN, TIBC, IRON, RETICCTPCT in the last 72 hours. Urine analysis:    Component Value Date/Time   BILIRUBINUR neg 10/22/2017 0855   PROTEINUR Negative 10/22/2017 0855   UROBILINOGEN 0.2 10/22/2017 0855   NITRITE neg 10/22/2017 0855   LEUKOCYTESUR Small (1+) (A) 10/22/2017 0855    Radiological Exams on Admission: Dg Chest 2 View  Result Date: 02/27/2018 CLINICAL DATA:  Chest pain; hx lung ca; former smoker EXAM: CHEST - 2 VIEW COMPARISON:  Chest CT on 02/09/2018 CT chest x-ray on 02/09/2018 FINDINGS: There has been significant improvement in aeration of the LEFT lung. The LEFT hilar region is enlarged,  consistent with known malignancy. There is no pulmonary edema. Small RIGHT pleural effusion or pleural thickening noted. Visualized osseous structures have a normal appearance. IMPRESSION: Significantly improved aeration in the LEFT lung. LEFT hilar mass. Electronically Signed   By: Nolon Nations M.D.   On: 02/27/2018 13:55   Ct Angio Chest Pe W And/or Wo Contrast  Result Date: 02/27/2018 CLINICAL DATA:  Chest pain.  History of lung cancer. EXAM: CT ANGIOGRAPHY CHEST WITH CONTRAST TECHNIQUE: Multidetector CT imaging of the chest was performed using the standard protocol during bolus administration of intravenous contrast. Multiplanar CT image reconstructions and MIPs were obtained to evaluate the vascular anatomy. CONTRAST:  122mL ISOVUE-370 IOPAMIDOL (ISOVUE-370) INJECTION 76% COMPARISON:  CT scan of February 09, 2018. FINDINGS: Cardiovascular: Satisfactory opacification of the pulmonary arteries to the segmental level. No evidence of pulmonary embolism. Normal heart size. No pericardial effusion. Nonocclusive embolus seen in lower lobe branch of right pulmonary artery on prior exam is no longer visualized. Mediastinum/Nodes: Thyroid gland and esophagus are unremarkable. 3.7 cm subcarinal lymph node is noted which is slightly decreased compared to prior exam. 15 mm left hilar lymph node is noted. Lungs/Pleura: No pneumothorax or pleural effusion is noted. 3.9 x 2.7 cm mass is noted in the superior segment of left lower lobe consistent with malignancy. 16 mm nodule is noted posteriorly in left upper lobe consistent with metastatic disease. Mild left basilar atelectasis is noted. Minimal right posterior basilar subsegmental atelectasis is noted. 19 x 15 mm cavitary lesion is noted in left upper lobe concerning for malignancy. Upper Abdomen: Stable right adrenal nodule is noted. No other abnormality seen in visualized portion of upper abdomen. Musculoskeletal: No chest wall abnormality. No acute or significant  osseous findings. Review of the MIP images confirms the above findings. IMPRESSION: No definite evidence of pulmonary embolus is noted currently. Nonocclusive thrombus seen in lower lobe branch of right pulmonary artery on prior exam is no longer visualized. 3.7 cm subcarinal lymph node is noted which is slightly decreased in size compared to prior exam. 1.5 cm left hilar lymph node is noted which could not be visualized on prior exam due to atelectasis and effusion. Left pleural effusion noted on prior exam has resolved. 3.9 x 2.7 cm mass is noted in the superior segment of left lower lobe consistent with malignancy. 16 mm nodule is noted posteriorly in left upper lobe consistent with metastatic disease. Also noted is 19 mm cavitary lesion in left upper lobe concerning for metastatic disease. Stable right adrenal nodule. Electronically Signed   By: Marijo Conception, M.D.   On:  02/27/2018 15:39    EKG: Independently reviewed.  Normal sinus rhythm, normal axis, normal intervals, no ST elevations or ST depressions, no significant T wave abnormalities  Assessment/Plan Active Problems:   NSTEMI (non-ST elevated myocardial infarction) ([place])  57 year old female who presented with nonexertional left upper chest pain, sharp in nature, 10 out of 10 in intensity, constant, no associated diaphoresis or dyspnea, denies any angina, PND, orthopnea lower extremity edema.  She does have stage IV lung adenocarcinoma localized in the left upper chest, recently finished local radiation therapy.  On the initial physical examination blood pressure is 108/86, heart rate 105, respiratory rate 19, oxygen saturation 94%.  Moist mucous membranes, lungs clear to auscultation bilaterally, heart S1-S2 present rhythmic, abdomen soft nontender, no lower extremity edema.  She does have reproducible chest pain upon palpation of the left upper chest wall as well as passive movement of the left upper extremity.  Sodium 137, potassium 3.7,  chloride 100, bicarb 32, glucose 99, BUN 19, creatinine 0.4, troponin high 0.24, white count 14.2, hemoglobin 14.7, hematocrit 46.7, platelets 200. Chest film with left hilar mass, bibasilar atelectasis, CT chest with cavitary lesion left upper lobe 19 mm, and metastatic 16 mm nodule, no pulmonary embolism.  Left lower lobe 3.9 x 2.7 cm mass.  Patient will be admitted to the hospital with the working diagnosis of atypical chest pain rule out acute coronary syndrome.  1.  Atypical chest pain, to rule out acute coronary syndrome.  Nonexertional chest pain, reproducible to palpation at the left upper chest wall as well as passive movement of the left upper extremity.  Will continue trending 3 sets of cardiac enzymes, so far her electrocardiogram is negative for ischemic changes. Follow-up cardiology recommendations and continue IV heparin for the next 24 hours.  Considering the features of her chest pain the pretest probability for acute coronary syndrome seems to be low.    2.  Stage IV lung adenocarcinoma.  Continue oximetry monitoring, patient has received radiation for metastatic lesions in the left upper chest.  Will continue pain control will topical diclofenac, scheduled ibuprofen and oxycodone/morphine for breakthrough pain.  No clinical signs of radiation pneumonitis.  Continue albuterol. GI prophylaxis.   3.  Tobacco abuse.  Continue smoking cessation.  4.  History of pulmonary embolism.  Patient has been on apixaban since February 10, 2018.  If patient rule out for acute coronary syndrome, will resume apixaban.  DVT prophylaxis: IV heparin  Code Status: full  Family Communication: no family at the bedside   Disposition Plan: telemetry   Consults called: Cardiology (ED)  Admission status: Observation     Mauricio Gerome Apley MD [facility] Pager [phone]  If 7PM-7AM, please contact night-coverage [email or web address] Password [email or web address]  02/27/2018, 4:03 PM

## 2018-02-27 NOTE — ED Notes (Signed)
Bed: WA02 Expected date:  Expected time:  Means of arrival:  Comments: 57 yo chest/back pain-recent lung CA dx

## 2018-02-28 DIAGNOSIS — G893 Neoplasm related pain (acute) (chronic): Secondary | ICD-10-CM

## 2018-02-28 LAB — CBC
HCT: 43.4 % (ref 36.0–46.0)
HEMOGLOBIN: 13.5 g/dL (ref 12.0–15.0)
MCH: 26.8 pg (ref 26.0–34.0)
MCHC: 31.1 g/dL (ref 30.0–36.0)
MCV: 86.3 fL (ref 80.0–100.0)
PLATELETS: 180 10*3/uL (ref 150–400)
RBC: 5.03 MIL/uL (ref 3.87–5.11)
RDW: 14.6 % (ref 11.5–15.5)
WBC: 8.9 10*3/uL (ref 4.0–10.5)
nRBC: 0 % (ref 0.0–0.2)

## 2018-02-28 LAB — TROPONIN I
TROPONIN I: 0.21 ng/mL — AB (ref <0.03)
Troponin I: 0.19 ng/mL (ref <0.03)

## 2018-02-28 LAB — BASIC METABOLIC PANEL
Anion gap: 13 (ref 5–15)
BUN: 17 mg/dL (ref 6–20)
CALCIUM: 9 mg/dL (ref 8.9–10.3)
CHLORIDE: 100 mmol/L (ref 98–111)
CO2: 23 mmol/L (ref 22–32)
CREATININE: 0.8 mg/dL (ref 0.44–1.00)
GFR calc Af Amer: >60 mL/min (ref >60)
GFR calc non Af Amer: >60 mL/min (ref >60)
Glucose, Bld: 93 mg/dL (ref 70–99)
Potassium: 3.7 mmol/L (ref 3.5–5.1)
SODIUM: 136 mmol/L (ref 135–145)

## 2018-02-28 LAB — HEPARIN LEVEL (UNFRACTIONATED): Heparin Unfractionated: 0.77 IU/mL — ABNORMAL HIGH (ref 0.30–0.70)

## 2018-02-28 LAB — APTT
APTT: 54 s — AB (ref 24–36)
APTT: 70 s — AB (ref 24–36)

## 2018-02-28 MED ORDER — PROCHLORPERAZINE MALEATE 10 MG PO TABS: 10.0000 mg | ORAL_TABLET | Freq: Four times a day (QID) | ORAL | Status: DC | PRN

## 2018-02-28 MED ORDER — OXYCODONE-ACETAMINOPHEN 5-325 MG PO TABS
2.0000 | ORAL_TABLET | ORAL | Status: DC | PRN
  Administered 2018-03-02: 2 via ORAL
  Filled 2018-02-28: qty 2

## 2018-02-28 MED ORDER — MORPHINE SULFATE (PF) 2 MG/ML IV SOLN
1.0000 mg | INTRAVENOUS | Status: DC | PRN
  Administered 2018-02-28: 1 mg via INTRAVENOUS
  Filled 2018-02-28: qty 1

## 2018-02-28 MED ORDER — POLYETHYLENE GLYCOL 3350 17 G PO PACK: 17.0000 g | PACK | Freq: Every day | ORAL | 0 refills | Status: AC

## 2018-02-28 MED ORDER — DEXTROSE-NACL 5-0.9 % IV SOLN
INTRAVENOUS | Status: DC
  Administered 2018-02-28 – 2018-03-01 (×2): via INTRAVENOUS

## 2018-02-28 MED ORDER — SUCRALFATE 1 G PO TABS: 1.0000 g | ORAL_TABLET | Freq: Three times a day (TID) | ORAL | Status: DC

## 2018-02-28 MED ORDER — DICLOFENAC SODIUM 1 % TD GEL: 2.0000 g | Freq: Four times a day (QID) | TRANSDERMAL | 0 refills | Status: AC

## 2018-02-28 MED ORDER — IBUPROFEN 400 MG PO TABS: 400.0000 mg | ORAL_TABLET | Freq: Three times a day (TID) | ORAL | 0 refills | Status: DC

## 2018-02-28 MED ORDER — OXYCODONE-ACETAMINOPHEN 5-325 MG PO TABS: 1.0000 | ORAL_TABLET | ORAL | 0 refills | Status: DC | PRN

## 2018-02-28 MED ORDER — PANTOPRAZOLE SODIUM 40 MG PO TBEC: 40.0000 mg | DELAYED_RELEASE_TABLET | Freq: Every day | ORAL | 0 refills | Status: AC

## 2018-02-28 MED ORDER — ALUM & MAG HYDROXIDE-SIMETH 200-200-20 MG/5ML PO SUSP
30.0000 mL | Freq: Once | ORAL | Status: DC
  Filled 2018-02-28: qty 30

## 2018-02-28 MED ORDER — PROMETHAZINE HCL 25 MG/ML IJ SOLN
25.0000 mg | INTRAMUSCULAR | Status: DC | PRN
  Administered 2018-02-28 – 2018-03-01 (×3): 25 mg via INTRAVENOUS
  Filled 2018-02-28 (×3): qty 1

## 2018-02-28 NOTE — Progress Notes (Signed)
Nausea not relieved by Phenergan.  Peppermint aromatherapy placed at bedside.  MD paged. Andre Lefort

## 2018-02-28 NOTE — Discharge Summary (Addendum)
Physician Discharge Summary  Lajuana Patchell ZDG:[id number] DOB: 10-Jun-1960 DOA: 02/27/2018  PCP: Patient, No Pcp Per  Admit date: 02/27/2018 Discharge date: 03/02/2018  Admitted From: Home  Disposition:  Home   Recommendations for Outpatient Follow-up and new medication changes:  1. Follow up with Dr Ethelene Hal in 7 days.  2. Patient has been placed on ibuprofen, topical diclofenac and oxycodone for pain control.  3. Follow up with Dr. Julien Nordmann as scheduled.   Home Health: yes   Equipment/Devices: yes    Discharge Condition: stable  CODE STATUS: Full  Diet recommendation: Regular  Brief/Interim Summary: 57 year old female who presented with nonexertional left upper chest pain, sharp in nature, 10 out of 10 in intensity, constant, no associated diaphoresis or dyspnea, denies any angina, PND, orthopnea lower extremity edema.  She does have stage IV lung adenocarcinoma localized in the left upper chest, recently finished local radiation therapy.  On the initial physical examination blood pressure is 108/86, heart rate 105, respiratory rate 19, oxygen saturation 94%.  Moist mucous membranes, lungs clear to auscultation bilaterally, heart S1-S2 present rhythmic, abdomen soft nontender, no lower extremity edema.  She does have reproducible chest pain upon palpation of the left upper chest wall as well as passive movement of the left upper extremity.  Sodium 137, potassium 3.7, chloride 100, bicarb 32, glucose 99, BUN 19, creatinine 0.4, troponin I 0.24, white count 14.2, hemoglobin 14.7, hematocrit 46.7, platelets 200. Chest film with left hilar mass, bibasilar atelectasis, CT chest with cavitary lesion left upper lobe 19 mm, and metastatic 16 mm nodule, no pulmonary embolism.  Left lower lobe 3.9 x 2.7 cm mass.  Patient will be admitted to the hospital with the working diagnosis of atypical chest pain rule out acute coronary syndrome.  1.  Atypical chest pain, musculoskeletal, bony metastasis,  left upper lung malignancy related, ruled out for acute coronary syndrome.  Patient was admitted to the medical ward, she was placed on a remote telemetry monitor, initially placed on IV heparin.  She received analgesics with ibuprofen, oxycodone, topical diclofenac and IV morphine.  Her troponins trending down to 0.19 at discharge, no acute coronary syndrome pattern, her EKG remained unchanged, acute coronary syndrome was ruled out and heparin was discontinued.  Patient will be seen by physical therapy and nutrition.  2.  Stage IV lung adenocarcinoma.  Patient completed radiation therapy to her left upper chest, plan for chemotherapy as an outpatient.  Chest pain likely origin are her bony metastasis, along with metastatic parenchymal lesions.  Her oxygen saturation at discharge is 92% on room air.   3.  History of pulmonary embolism.  Diagnosed in February 10, 2018, continue apixaban for anticoagulation.  Repeat chest CT no acute asthma on today's admission, considering her malignancy will continue anticoagulation.  4.  Tobacco abuse.  Smoking cessation.   Patient ruled out for STEMI.  Discharge Diagnoses:  Active Problems:   NSTEMI (non-ST elevated myocardial infarction) ([facility])   Chest pain    Discharge Instructions   Allergies as of 03/02/2018   No Known Allergies     Medication List    STOP taking these medications   amoxicillin-clavulanate 875-125 MG tablet Commonly known as:  AUGMENTIN   folic acid 1 MG tablet Commonly known as:  FOLVITE   prochlorperazine 10 MG tablet Commonly known as:  COMPAZINE   senna-docusate 8.6-50 MG tablet Commonly known as:  Senokot-S     TAKE these medications   albuterol 108 (90 Base) MCG/ACT inhaler Commonly known as:  PROVENTIL HFA;VENTOLIN HFA Inhale 1-2 puffs into the lungs every 6 (six) hours as needed for wheezing or shortness of breath.   albuterol (2.5 MG/3ML) 0.083% nebulizer solution Commonly known as:  PROVENTIL Take 3 mLs  (2.5 mg total) by nebulization every 6 (six) hours as needed for wheezing or shortness of breath.   chlorpheniramine-HYDROcodone 10-8 MG/5ML Suer Commonly known as:  TUSSIONEX Take 5 mLs by mouth every 12 (twelve) hours as needed for cough.   diclofenac sodium 1 % Gel Commonly known as:  VOLTAREN Apply 2 g topically 4 (four) times daily. Apply to left upper chest/ shoulder.   ELIQUIS STARTER PACK 5 MG Tabs Take as directed on package: start with two-5mg  tablets twice daily for 7 days. On day 8, switch to one-5mg  tablet twice daily.   OVER THE COUNTER MEDICATION Take 1 capsule by mouth daily. Supplement containing "black seed", garlic, olive leaf, ginger, cayenne.   oxyCODONE-acetaminophen 5-325 MG tablet Commonly known as:  PERCOCET/ROXICET Take 1 tablet by mouth every 8 (eight) hours as needed for severe pain.   pantoprazole 40 MG tablet Commonly known as:  PROTONIX Take 1 tablet (40 mg total) by mouth daily.   polyethylene glycol packet Commonly known as:  MIRALAX / GLYCOLAX Take 17 g by mouth daily.   Zinc 50 MG Caps Take 50 mg by mouth daily.       No Known Allergies  Consultations:  Cardiology    Procedures/Studies: Dg Chest 2 View  Result Date: 02/27/2018 CLINICAL DATA:  Chest pain; hx lung ca; former smoker EXAM: CHEST - 2 VIEW COMPARISON:  Chest CT on 02/09/2018 CT chest x-ray on 02/09/2018 FINDINGS: There has been significant improvement in aeration of the LEFT lung. The LEFT hilar region is enlarged, consistent with known malignancy. There is no pulmonary edema. Small RIGHT pleural effusion or pleural thickening noted. Visualized osseous structures have a normal appearance. IMPRESSION: Significantly improved aeration in the LEFT lung. LEFT hilar mass. Electronically Signed   By: Nolon Nations M.D.   On: 02/27/2018 13:55   Dg Chest 2 View  Result Date: 02/09/2018 CLINICAL DATA:  Stage IV lung the ligament C. New onset hemoptysis this morning associated  with shortness of breath and posterior chest pain. Former smoker. EXAM: CHEST - 2 VIEW COMPARISON:  CT scan of the chest of February 04, 2018 and PA and lateral chest x-ray of January 17, 2018 FINDINGS: Opacification of the left hemithorax is nearly total similar to that seen on the recent CT scan. There is some shift of the mediastinum toward the left. The right lung is well-expanded and clear. The observed bony thorax is unremarkable. IMPRESSION: Near total atelectasis of the left lung with large left pleural effusion consistent with a central obstructing lesion. And mild shift of the mediastinum from right to left. Clear right lung. Electronically Signed   By: David  Martinique M.D.   On: 02/09/2018 10:57   Ct Angio Chest Pe W And/or Wo Contrast  Result Date: 02/27/2018 CLINICAL DATA:  Chest pain.  History of lung cancer. EXAM: CT ANGIOGRAPHY CHEST WITH CONTRAST TECHNIQUE: Multidetector CT imaging of the chest was performed using the standard protocol during bolus administration of intravenous contrast. Multiplanar CT image reconstructions and MIPs were obtained to evaluate the vascular anatomy. CONTRAST:  11mL ISOVUE-370 IOPAMIDOL (ISOVUE-370) INJECTION 76% COMPARISON:  CT scan of February 09, 2018. FINDINGS: Cardiovascular: Satisfactory opacification of the pulmonary arteries to the segmental level. No evidence of pulmonary embolism. Normal heart size. No pericardial effusion.  Nonocclusive embolus seen in lower lobe branch of right pulmonary artery on prior exam is no longer visualized. Mediastinum/Nodes: Thyroid gland and esophagus are unremarkable. 3.7 cm subcarinal lymph node is noted which is slightly decreased compared to prior exam. 15 mm left hilar lymph node is noted. Lungs/Pleura: No pneumothorax or pleural effusion is noted. 3.9 x 2.7 cm mass is noted in the superior segment of left lower lobe consistent with malignancy. 16 mm nodule is noted posteriorly in left upper lobe consistent with metastatic  disease. Mild left basilar atelectasis is noted. Minimal right posterior basilar subsegmental atelectasis is noted. 19 x 15 mm cavitary lesion is noted in left upper lobe concerning for malignancy. Upper Abdomen: Stable right adrenal nodule is noted. No other abnormality seen in visualized portion of upper abdomen. Musculoskeletal: No chest wall abnormality. No acute or significant osseous findings. Review of the MIP images confirms the above findings. IMPRESSION: No definite evidence of pulmonary embolus is noted currently. Nonocclusive thrombus seen in lower lobe branch of right pulmonary artery on prior exam is no longer visualized. 3.7 cm subcarinal lymph node is noted which is slightly decreased in size compared to prior exam. 1.5 cm left hilar lymph node is noted which could not be visualized on prior exam due to atelectasis and effusion. Left pleural effusion noted on prior exam has resolved. 3.9 x 2.7 cm mass is noted in the superior segment of left lower lobe consistent with malignancy. 16 mm nodule is noted posteriorly in left upper lobe consistent with metastatic disease. Also noted is 19 mm cavitary lesion in left upper lobe concerning for metastatic disease. Stable right adrenal nodule. Electronically Signed   By: Marijo Conception, M.D.   On: 02/27/2018 15:39   Ct Angio Chest Pe W And/or Wo Contrast  Result Date: 02/09/2018 CLINICAL DATA:  Hemoptysis, shortness of breath EXAM: CT ANGIOGRAPHY CHEST WITH CONTRAST TECHNIQUE: Multidetector CT imaging of the chest was performed using the standard protocol during bolus administration of intravenous contrast. Multiplanar CT image reconstructions and MIPs were obtained to evaluate the vascular anatomy. CONTRAST:  15mL ISOVUE-370 IOPAMIDOL (ISOVUE-370) INJECTION 76% COMPARISON:  Chest x-ray 02/09/2018. Chest CT 01/17/2018. PET CT 02/04/2018. FINDINGS: Cardiovascular: Heart is normal size. Visualized aorta normal caliber. Filling defects are noted within  right lower lobe pulmonary arterial branches compatible with nonocclusive pulmonary embolus. No evidence of right heart strain Mediastinum/Nodes: Bulky right AP window nodal mass again noted, measuring 4.2 cm compared to 3.6 cm on prior PET CT. Other enlarged mediastinal lymph nodes in the right paratracheal region, subcarinal region and prevascular regions. Lungs/Pleura: Complete drowned lung on the left again noted, stable since prior PET CT due to occlusion of the left mainstem bronchus related to tumor and adenopathy. Moderate left pleural effusion. Right lung is clear. Upper Abdomen: Right adrenal nodule noted, stable. Musculoskeletal: Chest wall soft tissues are unremarkable. Destructive lytic lesion noted in the left 12th medial rib at the costovertebral junction on image 119 of series 7. Central lucent lesion noted in the the T1 vertebral body compatible with metastasis. Review of the MIP images confirms the above findings. IMPRESSION: Drowned lung on the left due to obstructing tumor and adenopathy obstructing the left mainstem bronchus. Moderate left pleural effusion. Right lung clear. Nonocclusive pulmonary emboli noted in the right lower lobe pulmonary artery. No evidence of right heart strain. Bony metastatic disease involving the medial left 12th rib and the T1 vertebral body. These results were called by telephone at the time of interpretation  on 02/09/2018 at 11:32 am to Dr. Gareth Morgan , who verbally acknowledged these results. Electronically Signed   By: Rolm Baptise M.D.   On: 02/09/2018 11:33   Nm Pet Image Initial (pi) Skull Base To Thigh  Result Date: 02/04/2018 CLINICAL DATA:  Initial treatment strategy for left hilar mass and adenopathy. EXAM: NUCLEAR MEDICINE PET SKULL BASE TO THIGH TECHNIQUE: 8.3 mCi F-18 FDG was injected intravenously. Full-ring PET imaging was performed from the skull base to thigh after the radiotracer. CT data was obtained and used for attenuation correction and  anatomic localization. Fasting blood glucose: 88 mg/dl COMPARISON:  CT chest 01/17/2018 FINDINGS: Mediastinal blood pool activity: SUV max 2.61 NECK: There is asymmetric uptake noted in the right arytenoid area. This is likely due to a paralyzed left vocal cord which appears to be bulging in the midline and likely due to recurrent laryngeal nerve involvement as it courses under the aortic arch on the left side. No neck mass or lymphadenopathy. Incidental CT findings: none CHEST: Completely drowned/obstructed left lung due to tumor obstructing the left mainstem bronchus. There are 3 hypermetabolic lesions in the left lung. The largest is at the left lung base and measures approximately 4 cm. The SUV max is 13.97. Slightly more superiorly in the left lower lobe is a 3 cm nodule has an SUV max of 19.3. The third lesion is in the left upper lobe and measures approximately 19 mm. SUV max is 9.11. 2 cm left hilar node is hypermetabolic with SUV max of 72.5. Large necrotic appearing subcarinal mass measures 3.6 cm and SUV max is 31.45. This also involves the left mainstem bronchus which is filled with tumor. Two metastatic prevascular lymph nodes are also noted and there is contralateral right paratracheal metastatic adenopathy. No definite metastatic pulmonary nodules are noted in the right lung. Incidental CT findings: Small left pleural effusion. ABDOMEN/PELVIS: Single metastatic lesion in the liver is noted centrally near the caudate lobe. This is difficult to identify on the non-contrast CT scan. It measures approximately 16 mm and the SUV max is 8.12. Bilateral adrenal gland metastasis. The right has an SUV max of 9.41 and the left has an SUV max of 6.13. Small lymph node in the gastrohepatic ligament region has an SUV max of 7.06. Incidental CT findings: Large anterior abdominal wall hernia containing fat and vessels. Soft tissue density in the hernia is slightly hypermetabolic and may be due to fat necrosis or  prior incarceration. No overt tumor. SKELETON: Osseous metastatic disease is demonstrated. There are lesions involving the left clavicle, both upper scapulae, numerous vertebral bodies, transverse processes, ribs, sternum and pelvis. No spinal canal compromise is demonstrated. Incidental CT findings: none IMPRESSION: 1. Completely drowned/obstructed left lung due to extensive tumor in the left mainstem bronchus with surrounding bulky mediastinal lymphadenopathy. 2. Three left lung lesions along with left hilar, prevascular, AP window, subcarinal and right paratracheal adenopathy. 3. Single hepatic metastatic focus. 4. Bilateral adrenal gland metastasis. 5. Extensive osseous metastatic disease. Electronically Signed   By: Marijo Sanes M.D.   On: 02/04/2018 16:50       Subjective: Patient continue to have pain on her left upper chest and shoulder, worse with movement of her left arm, and to palpation. Poor appetite and mild odynphagia.   Discharge Exam: Vitals:   02/27/18 2034 02/28/18 0440  BP: 111/80 111/83  Pulse: (!) 102 92  Resp:    Temp: 97.8 F (36.6 C) 97.7 F (36.5 C)  SpO2: 93% 92%  Vitals:   02/27/18 1700 02/27/18 1751 02/27/18 2034 02/28/18 0440  BP: 112/86 112/89 111/80 111/83  Pulse: (!) 102 (!) 127 (!) 102 92  Resp: 19 16    Temp:  98.3 F (36.8 C) 97.8 F (36.6 C) 97.7 F (36.5 C)  TempSrc:  Oral Oral Oral  SpO2: 94% 94% 93% 92%    General: deconditioned  Neurology: Awake and alert, non focal  E ENT: mild pallor, no icterus, oral mucosa moist Cardiovascular: No JVD. S1-S2 present, rhythmic, no gallops, rubs, or murmurs. No lower extremity edema. Pulmonary: positive breath sounds bilaterally, adequate air movement, no wheezing, rhonchi or rales. Gastrointestinal. Abdomen with, no organomegaly, non tender, no rebound or guarding Skin. No rashes Musculoskeletal: no joint deformities/ pain to palpation on her left upper chest and to passive left arm movement.     The results of significant diagnostics from this hospitalization (including imaging, microbiology, ancillary and laboratory) are listed below for reference.     Microbiology: No results found for this or any previous visit (from the past 240 hour(s)).   Labs: BNP (last 3 results) Recent Labs    02/09/18 1005  BNP 62.5   Basic Metabolic Panel: Recent Labs  Lab 02/27/18 1327 02/28/18 0439  NA 137 136  K 3.7 3.7  CL 100 100  CO2 22 23  GLUCOSE 99 93  BUN 19 17  CREATININE 0.84 0.80  CALCIUM 9.3 9.0   Liver Function Tests: No results for input(s): AST, ALT, ALKPHOS, BILITOT, PROT, ALBUMIN in the last 168 hours. No results for input(s): LIPASE, AMYLASE in the last 168 hours. No results for input(s): AMMONIA in the last 168 hours. CBC: Recent Labs  Lab 02/27/18 1327 02/28/18 0439  WBC 14.2* 8.9  NEUTROABS 12.2*  --   HGB 14.7 13.5  HCT 46.7* 43.4  MCV 84.9 86.3  PLT 200 180   Cardiac Enzymes: Recent Labs  Lab 02/27/18 1327 02/27/18 1808 02/27/18 2321 02/28/18 0439  TROPONINI 0.24* 0.22* 0.21* 0.19*   BNP: Invalid input(s): POCBNP CBG: No results for input(s): GLUCAP in the last 168 hours. D-Dimer No results for input(s): DDIMER in the last 72 hours. Hgb A1c No results for input(s): HGBA1C in the last 72 hours. Lipid Profile No results for input(s): CHOL, HDL, LDLCALC, TRIG, CHOLHDL, LDLDIRECT in the last 72 hours. Thyroid function studies No results for input(s): TSH, T4TOTAL, T3FREE, THYROIDAB in the last 72 hours.  Invalid input(s): FREET3 Anemia work up No results for input(s): VITAMINB12, FOLATE, FERRITIN, TIBC, IRON, RETICCTPCT in the last 72 hours. Urinalysis    Component Value Date/Time   BILIRUBINUR neg 10/22/2017 0855   PROTEINUR Negative 10/22/2017 0855   UROBILINOGEN 0.2 10/22/2017 0855   NITRITE neg 10/22/2017 0855   LEUKOCYTESUR Small (1+) (A) 10/22/2017 0855   Sepsis Labs Invalid input(s): PROCALCITONIN,  WBC,   LACTICIDVEN Microbiology No results found for this or any previous visit (from the past 240 hour(s)).   Time coordinating discharge: 45 minutes  SIGNED:   Tawni Millers, MD  [facility] 02/28/2018, 9:32 AM Pager [phone]  If 7PM-7AM, please contact night-coverage [email or web address] Password [email or web address]

## 2018-02-28 NOTE — Progress Notes (Signed)
Pt continues to have nausea with some vomiting.  Dr. Cathlean Sauer notified.  Phenergan order received and given. Andre Lefort

## 2018-02-28 NOTE — Progress Notes (Signed)
ANTICOAGULATION CONSULT NOTE - Follow Up Consult  Pharmacy Consult for Heparin Indication: chest pain/ACS  No Known Allergies  Patient Measurements:   Heparin Dosing Weight:   Vital Signs: Temp: 97.8 F (36.6 C) (10/25 2034) Temp Source: Oral (10/25 2034) BP: 111/80 (10/25 2034) Pulse Rate: 102 (10/25 2034)  Labs: Recent Labs    02/27/18 1327 02/27/18 1808 02/27/18 2321  HGB 14.7  --   --   HCT 46.7*  --   --   PLT 200  --   --   APTT  --   --  54*  CREATININE 0.84  --   --   TROPONINI 0.24* 0.22*  --     Estimated Creatinine Clearance: 67.1 mL/min (by C-G formula based on SCr of 0.84 mg/dL).   Medications:  Infusions:  . sodium chloride 250 mL (02/27/18 2022)  . heparin 800 Units/hr (02/27/18 1622)    Assessment: Patient with low PTT level.  No heparin issues per RN.  PTT ordered with Heparin level until both correlate due to possible drug-lab interaction between oral anticoagulant (rivaroxaban, edoxaban, or apixaban) and anti-Xa level (aka heparin level)   Goal of Therapy:  Heparin level 0.3-0.7 units/ml aPTT 66-102 seconds Monitor platelets by anticoagulation protocol: Yes   Plan:  Increase heparin to 1050 units/hr Recheck levels at 0800  Tyler Deis, Shea Stakes Crowford 02/28/2018,12:39 AM

## 2018-02-28 NOTE — Progress Notes (Signed)
PT Cancellation Note  Patient Details Name: Nieshia Larmon MRN: [id number] DOB: 08-Feb-1961   Cancelled Treatment:    Reason Eval/Treat Not Completed: Medical issues which prohibited therapy. Attempted PT eval. Pt reported she had just vomited prior to PT entering room. Pt politely requested PT check back later.    Weston Anna, PT Acute Rehabilitation Services Pager: [phone] Office: [phone]

## 2018-02-28 NOTE — Progress Notes (Signed)
PT Cancellation Note  Patient Details Name: Rhonda Meyers MRN: [id number] DOB: 04/07/1961   Cancelled Treatment:    Reason Eval/Treat Not Completed: Medical issues which prohibited therapycurrently with emesis. RN aware.    Wardell Pokorski Gann Pager [phone] Office [phone]  02/28/2018, 3:45 PM

## 2018-02-28 NOTE — Progress Notes (Signed)
I have resumed care for this patient and agree with previous RN's shift assessment. Patient and family member at bedside made aware. Pt. Ambulating to BR, no c/o pain. Will continue to monitor closely

## 2018-03-01 DIAGNOSIS — R112 Nausea with vomiting, unspecified: Secondary | ICD-10-CM | POA: Diagnosis present

## 2018-03-01 LAB — CBC
HEMATOCRIT: 45.8 % (ref 36.0–46.0)
HEMOGLOBIN: 14.7 g/dL (ref 12.0–15.0)
MCH: 26.8 pg (ref 26.0–34.0)
MCHC: 32.1 g/dL (ref 30.0–36.0)
MCV: 83.6 fL (ref 80.0–100.0)
Platelets: 154 10*3/uL (ref 150–400)
RBC: 5.48 MIL/uL — ABNORMAL HIGH (ref 3.87–5.11)
RDW: 14.6 % (ref 11.5–15.5)
WBC: 14 10*3/uL — ABNORMAL HIGH (ref 4.0–10.5)
nRBC: 0 % (ref 0.0–0.2)

## 2018-03-01 MED ORDER — SUCRALFATE 1 G PO TABS
1.0000 g | ORAL_TABLET | Freq: Three times a day (TID) | ORAL | Status: DC
  Administered 2018-03-01 – 2018-03-02 (×2): 1 g via ORAL
  Filled 2018-03-01 (×4): qty 1

## 2018-03-01 MED ORDER — METOCLOPRAMIDE HCL 5 MG/ML IJ SOLN
10.0000 mg | Freq: Once | INTRAMUSCULAR | Status: AC
  Administered 2018-03-01: 10 mg via INTRAVENOUS
  Filled 2018-03-01: qty 2

## 2018-03-01 MED ORDER — FAMOTIDINE IN NACL 20-0.9 MG/50ML-% IV SOLN
20.0000 mg | Freq: Two times a day (BID) | INTRAVENOUS | Status: DC
  Administered 2018-03-01 (×2): 20 mg via INTRAVENOUS
  Filled 2018-03-01 (×2): qty 50

## 2018-03-01 MED ORDER — BOOST / RESOURCE BREEZE PO LIQD CUSTOM
1.0000 | Freq: Two times a day (BID) | ORAL | Status: DC
  Administered 2018-03-01 – 2018-03-02 (×2): 1 via ORAL

## 2018-03-01 MED ORDER — APIXABAN 5 MG PO TABS
5.0000 mg | ORAL_TABLET | Freq: Two times a day (BID) | ORAL | Status: DC
  Administered 2018-03-01 – 2018-03-02 (×3): 5 mg via ORAL
  Filled 2018-03-01 (×3): qty 1

## 2018-03-01 NOTE — Progress Notes (Signed)
Initial Nutrition Assessment  DOCUMENTATION CODES:   Obesity unspecified  INTERVENTION:   Provide Boost Breeze po BID, each supplement provides 250 kcal and 9 grams of protein  NUTRITION DIAGNOSIS:   Increased nutrient needs related to cancer and cancer related treatments as evidenced by estimated needs.  GOAL:   Patient will meet greater than or equal to 90% of their needs  MONITOR:   PO intake, Supplement acceptance, Diet advancement, Weight trends, Labs, I & O's  REASON FOR ASSESSMENT:   Consult Assessment of nutrition requirement/status  ASSESSMENT:   57 year old female who presented with nonexertional left upper chest pain, sharp in nature, 10 out of 10 in intensity. She does have stage IV lung adenocarcinoma localized in the left upper chest, recently finished local radiation therapy.   Patient current request is to rest given that she has had N/V overnight. Pt developed N/V on 10/26 when she was to discharge home. Aromatherapy and medications have so far been unsuccessful in helping her nausea. Per Oncology notes, pt is s/p palliative radiotherapy for bone mets associated with lung cancer. Plan is for patient to begin chemotherapy.  MD has changed pt's diet to full liquids. Will send Boost Breeze for patient to try once nausea is more under control.   Per weight records, pt has lost 17 lb since 9/25 (10% wt loss x 1 month, significant for time frame).  Labs reviewed. Medications: Folic acid tablet daily, IV Reglan once, Miralax packet daily, Senokot-S tablet BID, inc sulfate capsule daily, D5-.9% NaCl infusion at 75 ml/hr, IV Phenergan PRN  NUTRITION - FOCUSED PHYSICAL EXAM:  Deferred, pt would like to rest at this time.  Diet Order:   Diet Order            Diet full liquid Room service appropriate? Yes; Fluid consistency: Thin  Diet effective now        Diet - low sodium heart healthy              EDUCATION NEEDS:   Not appropriate for education at  this time  Skin:  Skin Assessment: Reviewed RN Assessment  Last BM:  10/24  Height:   Ht Readings from Last 1 Encounters:  02/25/18 5' 0.98" (1.549 m)    Weight:   Wt Readings from Last 1 Encounters:  02/25/18 72.1 kg    Ideal Body Weight:  45.5 kg  BMI:  31.1 kg/m^2  Estimated Nutritional Needs:   Kcal:  1600-1800  Protein:  70-80g  Fluid:  1.8L/day   Clayton Bibles, MS, RD, LDN [place] Dietitian Pager: [phone] After Hours Pager: [phone]

## 2018-03-01 NOTE — Progress Notes (Addendum)
PROGRESS NOTE    Pegi Milazzo  ZOX:[id number] DOB: 02/21/1961 DOA: 02/27/2018 PCP: Patient, No Pcp Per    Brief Narrative:  57 year old female who presented withnonexertional left upper chest pain, sharp in nature, 10 out of 10 in intensity, constant, no associated diaphoresis or dyspnea, denies any angina,PND,orthopnea lower extremity edema.She does have stage IV lung adenocarcinoma localized in the left upper chest, recently finished localradiation therapy. On the initial physical examination blood pressure is 108/86, heart rate 105, respiratory rate 19, oxygen saturation 94%.Moist mucous membranes, lungs clear to auscultation bilaterally, heart S1-S2 present rhythmic, abdomen soft nontender, no lower extremity edema. She does have reproducible chest pain upon palpation of the left upper chest wall as well as passive movement of the left upper extremity.Sodium 137, potassium 3.7, chloride 100, bicarb 32, glucose 99, BUN 19, creatinine 0.4, troponin I 0.24, white count 14.2, hemoglobin 14.7, hematocrit 46.7, platelets 200. Chest filmwith left hilar mass, bibasilar atelectasis, CT chest with cavitary lesion left upper lobe19 mm,and metastatic16 mm nodule,no pulmonary embolism. Left lower lobe 3.9 x 2.7 cm mass.  Patient will be admitted to the hospital withtheworking diagnosis of atypical chest pain rule out acute coronary syndrome.    Assessment & Plan:   Active Problems:   NSTEMI (non-ST elevated myocardial infarction) (HCC)   Chest pain   1. Intractable nausea and vomiting. Patient had persistent nausea and vomiting, intractable despite IV antiemetics over last 24 hours, today she is very weak and deconditioned, continue with no po toleration, will consider a failed outpatient management and will change to inpatient. Will continue compazine and phenergan, increase IV fluids to 75 ml per hour, add IV famotidine bid and tid sucralfate. Change diet to full liquids.  Pending physical therapy evaluation.   1.  Atypical chest pain, musculoskeletal, bony metastasis, left upper lung malignancy related, ruled out for acute coronary syndrome.  Will continue pain control with topical diclofenac, scheduled ibuprofen and as needed oxycodone and IV morphine. Out of bed as tolerated tid with meals.   2.  Stage IV lung adenocarcinoma.  sp radiation therapy to her left upper chest. Will need follow up as outpatient.  3.  History of pulmonary embolism.  Diagnosed in February 10, 2018, Will resume anticoagulation with apixaban.   4.  Tobacco abuse.  Continue smoking cessation.   5. Unspecified calorie protein malnutrition. Will follow on nutrition recommendations.     DVT prophylaxis: apixaban   Code Status:  full Family Communication: I spoke with patient's husband at the bedside and all questions were addressed.  Disposition Plan/ discharge barriers: pending clinical improvement    Consultants:     Procedures:     Antimicrobials:       Subjective: Patient continue to have intractable nausea and vomiting, despite IV antiemetics, this am with no po intake very weak and deconditioned. Nause and vomiting severe in intensity, associated with no oral intake, no improving or worsening factors. Persistent chest pain, improved with analgesics but not back to baseline.   Objective: Vitals:   02/28/18 0440 02/28/18 1428 02/28/18 2032 03/01/18 0530  BP: 111/83 (!) 131/96 (!) 134/91 125/90  Pulse: 92 96 92 (!) 107  Resp:  18 18 17   Temp: 97.7 F (36.5 C) 98.5 F (36.9 C) 98.1 F (36.7 C) 98 F (36.7 C)  TempSrc: Oral Oral Oral Oral  SpO2: 92% 96% 94% 97%    Intake/Output Summary (Last 24 hours) at 03/01/2018 1044 Last data filed at 03/01/2018 0955 Gross per 24 hour  Intake 716.44 ml  Output -  Net 716.44 ml   There were no vitals filed for this visit.  Examination:   General: deconditioned and ill looking appearing  Neurology: Awake and  alert, non focal  E ENT: no pallor, no icterus, oral mucosa dry Cardiovascular: No JVD. S1-S2 present, rhythmic, no gallops, rubs, or murmurs. No lower extremity edema. Pulmonary: positive breath sounds bilaterally, decreased air movement, due to poor inspiratory effort, no wheezing, rhonchi or rales. Gastrointestinal. Abdomen mild distended no organomegaly, non tender, no rebound or guarding Skin. No rashes Musculoskeletal: no joint deformities     Data Reviewed: I have personally reviewed following labs and imaging studies  CBC: Recent Labs  Lab 02/27/18 1327 02/28/18 0439 03/01/18 0440  WBC 14.2* 8.9 14.0*  NEUTROABS 12.2*  --   --   HGB 14.7 13.5 14.7  HCT 46.7* 43.4 45.8  MCV 84.9 86.3 83.6  PLT 200 180 800   Basic Metabolic Panel: Recent Labs  Lab 02/27/18 1327 02/28/18 0439  NA 137 136  K 3.7 3.7  CL 100 100  CO2 22 23  GLUCOSE 99 93  BUN 19 17  CREATININE 0.84 0.80  CALCIUM 9.3 9.0   GFR: Estimated Creatinine Clearance: 70.4 mL/min (by C-G formula based on SCr of 0.8 mg/dL). Liver Function Tests: No results for input(s): AST, ALT, ALKPHOS, BILITOT, PROT, ALBUMIN in the last 168 hours. No results for input(s): LIPASE, AMYLASE in the last 168 hours. No results for input(s): AMMONIA in the last 168 hours. Coagulation Profile: No results for input(s): INR, PROTIME in the last 168 hours. Cardiac Enzymes: Recent Labs  Lab 02/27/18 1327 02/27/18 1808 02/27/18 2321 02/28/18 0439  TROPONINI 0.24* 0.22* 0.21* 0.19*   BNP (last 3 results) No results for input(s): PROBNP in the last 8760 hours. HbA1C: No results for input(s): HGBA1C in the last 72 hours. CBG: No results for input(s): GLUCAP in the last 168 hours. Lipid Profile: No results for input(s): CHOL, HDL, LDLCALC, TRIG, CHOLHDL, LDLDIRECT in the last 72 hours. Thyroid Function Tests: No results for input(s): TSH, T4TOTAL, FREET4, T3FREE, THYROIDAB in the last 72 hours. Anemia Panel: No results  for input(s): VITAMINB12, FOLATE, FERRITIN, TIBC, IRON, RETICCTPCT in the last 72 hours.    Radiology Studies: I have reviewed all of the imaging during this hospital visit personally     Scheduled Meds: . alum & mag hydroxide-simeth  30 mL Oral Once  . apixaban  5 mg Oral BID  . diclofenac sodium  2 g Topical QID  . folic acid  1 mg Oral Daily  . ibuprofen  400 mg Oral TID  . pantoprazole  40 mg Oral Daily  . polyethylene glycol  17 g Oral Daily  . senna-docusate  1 tablet Oral BID  . sodium chloride flush  3 mL Intravenous Q12H  . zinc sulfate  220 mg Oral Daily   Continuous Infusions: . sodium chloride Stopped (02/28/18 0953)  . dextrose 5 % and 0.9% NaCl 50 mL/hr at 02/28/18 1856     LOS: 1 day        Tawni Millers, MD [facility] Pager [phone]

## 2018-03-01 NOTE — Progress Notes (Signed)
I have followed pt peripherally during weekend I do not think there is significant active coronary ischemia CP is atypical   Will be available if symptoms change   Please call.  Will sign off.  Dorris Carnes

## 2018-03-01 NOTE — Progress Notes (Signed)
PT Cancellation Note  Patient Details Name: Rhonda Meyers MRN: [id number] DOB: 25-Oct-1960   Cancelled Treatment:    Reason Eval/Treat Not Completed: Other (comment). Pt and husband politely refused stating pt had been up all night with nausea and wanted to rest.  They requested PT come back after pt has been able to sleep some.  Will check back as able.   Galen Manila 03/01/2018, 9:01 AM

## 2018-03-01 NOTE — Progress Notes (Signed)
Patient called and stated she wants to leave now, reinforced/reminded patient of the plan of care as discussed with patient/husband/MD this AM, patient stated she does not care she just want to go home, patient all dressed. Dr. Cathlean Sauer informed and patient's significant other informed as well, significant other on the way to talk to patient.

## 2018-03-01 NOTE — Progress Notes (Signed)
Pt. Nauseated throughout the shift and had 4 vomiting episodes of bile despite phenergan administered x2 and aromatherapy at bedside. NP made aware. Pt. Aggressively dry heaving/vomiting at 0530. On call NP paged and made aware. One time dose of compazine IV obtained and administered to patient. Pt. Able to rest after compazine. Will continue to monitor.

## 2018-03-01 NOTE — Care Management Note (Signed)
Case Management Note  Patient Details  Name: Rhonda Meyers MRN: [id number] Date of Birth: [birth year]  Subjective/Objective:  CM referral for personal care services. Noted PT recc no f/u. Patient doesn't have any wounds,& understands when to take her meds, & f/u w/pcp.She says her spouse will help with asst w/adl's. She understands without a skilled need she would have to pay out of pocket for personal care services-she voiced understanding. MD notified of the needs & of patient decision to decline any HHC if she would have to pay for non skilled services.                  Action/Plan:d/c home.   Expected Discharge Date:  (unknown)               Expected Discharge Plan:  Home/Self Care  In-House Referral:     Discharge planning Services     Post Acute Care Choice:    Choice offered to:     DME Arranged:    DME Agency:     HH Arranged:    HH Agency:     Status of Service:  In process, will continue to follow  If discussed at Long Length of Stay Meetings, dates discussed:    Additional Comments:  Dessa Phi, RN 03/01/2018, 3:27 PM

## 2018-03-01 NOTE — Evaluation (Signed)
Physical Therapy Evaluation Patient Details Name: Rhonda Meyers MRN: [id number] DOB: May 19, 1960 Today's Date: 03/01/2018   History of Present Illness  57 year old female who presented with nonexertional left upper chest pain, sharp in nature, 10 out of 10 in intensity, constant, no associated diaphoresis or dyspnea, denies any angina, PND, orthopnea lower extremity edema.  She does have stage IV lung adenocarcinoma localized in the left upper chest, recently finished local radiation therapy.  Clinical Impression  Pt admitted with above diagnosis. Pt currently with functional limitations due to the deficits listed below (see PT Problem List). Pt will benefit from skilled PT to increase their independence and safety with mobility to allow discharge to the venue listed below.  Pt moving well overall, but will follow acutely for higher level gait challenges and stair training.  At this time, she will not need any follow up PT after d/c.     Follow Up Recommendations No PT follow up    Equipment Recommendations  None recommended by PT    Recommendations for Other Services       Precautions / Restrictions Precautions Precautions: None Restrictions Weight Bearing Restrictions: No      Mobility  Bed Mobility Overal bed mobility: Modified Independent                Transfers Overall transfer level: Modified independent                  Ambulation/Gait Ambulation/Gait assistance: Supervision Gait Distance (Feet): 350 Feet Assistive device: None Gait Pattern/deviations: Step-through pattern     General Gait Details: Pt able to ambulate lap of unit with HR 115 and o2 95% on room air.  Stairs            Wheelchair Mobility    Modified Rankin (Stroke Patients Only)       Balance Overall balance assessment: Modified Independent                                           Pertinent Vitals/Pain Pain Assessment: Faces Pain Score: 4   Pain Location: L chest Pain Descriptors / Indicators: Sore Pain Intervention(s): Monitored during session;Other (comment)(RN was addressing upon entry)    [place] expects to be discharged to:: Private residence Living Arrangements: Other relatives   Type of Home: House     Entrance Stairs-Number of Steps: 1 Home Layout: Two level;1/2 bath on main level Home Equipment: None      Prior Function Level of Independence: Independent               Hand Dominance        Extremity/Trunk Assessment   Upper Extremity Assessment Upper Extremity Assessment: LUE deficits/detail LUE Deficits / Details: L UE ROM  limited by pain with certain movements. Limits use of it.    Lower Extremity Assessment Lower Extremity Assessment: Overall WFL for tasks assessed       Communication   Communication: No difficulties  Cognition Arousal/Alertness: Awake/alert Behavior During Therapy: WFL for tasks assessed/performed Overall Cognitive Status: Within Functional Limits for tasks assessed                                        General Comments      Exercises     Assessment/Plan  PT Assessment Patient needs continued PT services  PT Problem List Decreased activity tolerance;Decreased balance;Decreased mobility       PT Treatment Interventions DME instruction;Gait training;Stair training;Functional mobility training;Therapeutic exercise;Therapeutic activities    PT Goals (Current goals can be found in the Care Plan section)  Acute Rehab PT Goals Patient Stated Goal: go home PT Goal Formulation: With patient Time For Goal Achievement: 03/15/18 Potential to Achieve Goals: Good    Frequency Min 3X/week   Barriers to discharge        Co-evaluation               AM-PAC PT "6 Clicks" Daily Activity  Outcome Measure Difficulty turning over in bed (including adjusting bedclothes, sheets and blankets)?: None Difficulty moving from  lying on back to sitting on the side of the bed? : None Difficulty sitting down on and standing up from a chair with arms (e.g., wheelchair, bedside commode, etc,.)?: None Help needed moving to and from a bed to chair (including a wheelchair)?: None Help needed walking in hospital room?: A Little Help needed climbing 3-5 steps with a railing? : A Little 6 Click Score: 22    End of Session Equipment Utilized During Treatment: Gait belt Activity Tolerance: Patient tolerated treatment well Patient left: in bed;with call bell/phone within reach;with family/visitor present Nurse Communication: Mobility status PT Visit Diagnosis: Difficulty in walking, not elsewhere classified (R26.2)    Time: 5885-0277 PT Time Calculation (min) (ACUTE ONLY): 21 min   Charges:   PT Evaluation $PT Eval Low Complexity: 1 Low          Jaxiel Kines L. Tamala Julian, [place] Pager [phone] 03/01/2018   Galen Manila 03/01/2018, 1:30 PM

## 2018-03-01 NOTE — Progress Notes (Signed)
While applying voltaren cream on patient, she C/O new lump on left upper chest above the breast X2 patient/husband stated it's new; Dr. Cathlean Sauer informed.

## 2018-03-01 NOTE — Progress Notes (Signed)
During Bedside Shift Report, patient was very agitated, anxious and was attempting to leave AMA. This RN and previous shift RN explained to the patient and patient husband the process of leaving AMA. Patient was adamant on leaving, but husband and brother of patient asked if they can try to diffuse the situation in order to have patient stay in the hospital one more night since discharge is to be planned in the AM if nausea and vomiting subsides throughout the night. This RN allowed patient family to intervene since patient was verbally abusive to staff and "does not trust [place]." Patient stepped off the floor with family and returned calm and cooperative. Patient stated she "just needed to get some fresh air, and I better be discharged very early in the morning." Patient agreeing to stay overnight for observation. Pt. Refused some HS medicines and refused IVF. Pt. Is tolerating PO intake well with no nausea or vomiting. On call NP paged and made aware of situation. Will continue to monitor.

## 2018-03-02 ENCOUNTER — Ambulatory Visit: Payer: 59 | Admitting: Radiation Oncology

## 2018-03-02 ENCOUNTER — Inpatient Hospital Stay: Payer: 59

## 2018-03-02 ENCOUNTER — Other Ambulatory Visit: Payer: 59

## 2018-03-02 ENCOUNTER — Ambulatory Visit (HOSPITAL_COMMUNITY): Admission: RE | Admit: 2018-03-02 | Payer: 59 | Source: Ambulatory Visit

## 2018-03-02 LAB — CBC
HEMATOCRIT: 44.7 % (ref 36.0–46.0)
HEMOGLOBIN: 14 g/dL (ref 12.0–15.0)
MCH: 26.7 pg (ref 26.0–34.0)
MCHC: 31.3 g/dL (ref 30.0–36.0)
MCV: 85.1 fL (ref 80.0–100.0)
NRBC: 0 % (ref 0.0–0.2)
Platelets: 164 10*3/uL (ref 150–400)
RBC: 5.25 MIL/uL — ABNORMAL HIGH (ref 3.87–5.11)
RDW: 14.6 % (ref 11.5–15.5)
WBC: 10.1 10*3/uL (ref 4.0–10.5)

## 2018-03-02 LAB — BASIC METABOLIC PANEL
ANION GAP: 12 (ref 5–15)
BUN: 17 mg/dL (ref 6–20)
CALCIUM: 9.1 mg/dL (ref 8.9–10.3)
CHLORIDE: 104 mmol/L (ref 98–111)
CO2: 25 mmol/L (ref 22–32)
Creatinine, Ser: 0.75 mg/dL (ref 0.44–1.00)
GFR calc Af Amer: >60 mL/min (ref >60)
GFR calc non Af Amer: >60 mL/min (ref >60)
Glucose, Bld: 102 mg/dL — ABNORMAL HIGH (ref 70–99)
Potassium: 3 mmol/L — ABNORMAL LOW (ref 3.5–5.1)
SODIUM: 141 mmol/L (ref 135–145)

## 2018-03-02 MED ORDER — POTASSIUM CHLORIDE CRYS ER 20 MEQ PO TBCR
40.0000 meq | EXTENDED_RELEASE_TABLET | ORAL | Status: DC
  Administered 2018-03-02: 40 meq via ORAL
  Filled 2018-03-02: qty 2

## 2018-03-02 MED ORDER — IBUPROFEN 200 MG PO TABS: 400.0000 mg | ORAL_TABLET | Freq: Three times a day (TID) | ORAL | Status: DC | PRN

## 2018-03-02 MED ORDER — FAMOTIDINE 20 MG PO TABS
20.0000 mg | ORAL_TABLET | Freq: Two times a day (BID) | ORAL | Status: DC
  Administered 2018-03-02: 20 mg via ORAL
  Filled 2018-03-02: qty 1

## 2018-03-02 NOTE — Progress Notes (Signed)
Patient discharged home with brother, discharge instructions given and explained to patient/family and they verbalized understanding. Patient denies any distress. No wound noted, skin intact. Accompanied home by brother, transported to the car by staff.

## 2018-03-02 NOTE — Care Management Note (Signed)
Case Management Note  Patient Details  Name: Rhonda Meyers MRN: [id number] Date of Birth: 02-17-1961  Subjective/Objective:                  Discharged to home with self care and family support  Action/Plan: Discharge to home with self care, orders checked for hhc needs. No CM needs present at time of discharge.  Expected Discharge Date:  03/02/18               Expected Discharge Plan:  Home/Self Care  In-House Referral:     Discharge planning Services  CM Consult  Post Acute Care Choice:    Choice offered to:     DME Arranged:    DME Agency:     HH Arranged:    HH Agency:     Status of Service:  Completed, signed off  If discussed at [employer] of Stay Meetings, dates discussed:    Additional Comments:  Leeroy Cha, RN 03/02/2018, 11:51 AM

## 2018-03-02 NOTE — Progress Notes (Signed)
This morning patient is feeling better, no further nausea or vomiting, tolerating p.o. diet adequately, her vital signs have been stable, her abdomen is soft nontender, physical therapy, no follow-up.  Patient will be discharged home today.

## 2018-03-02 NOTE — Care Management Note (Signed)
Case Management Note  Patient Details  Name: Rhonda Meyers MRN: [id number] Date of Birth: 1961-04-26  Subjective/Objective:                  Discharged to home refused hhc services  Action/Plan: Discharge to home with self care, orders checked for hhc needs. No CM needs present at time of discharge.  Expected Discharge Date:  03/02/18               Expected Discharge Plan:  Home/Self Care  In-House Referral:     Discharge planning Services  CM Consult  Post Acute Care Choice:    Choice offered to:     DME Arranged:    DME Agency:     HH Arranged:  Patient Refused [place] Agency:     Status of Service:  Completed, signed off  If discussed at [employer] of Stay Meetings, dates discussed:    Additional Comments:  Leeroy Cha, RN 03/02/2018, 11:54 AM

## 2018-03-03 ENCOUNTER — Telehealth: Payer: Self-pay | Admitting: Medical Oncology

## 2018-03-03 ENCOUNTER — Other Ambulatory Visit: Payer: Self-pay | Admitting: *Deleted

## 2018-03-03 ENCOUNTER — Encounter: Payer: Self-pay | Admitting: *Deleted

## 2018-03-03 ENCOUNTER — Inpatient Hospital Stay: Payer: 59 | Admitting: Family Medicine

## 2018-03-03 DIAGNOSIS — C3492 Malignant neoplasm of unspecified part of left bronchus or lung: Secondary | ICD-10-CM

## 2018-03-03 MED ORDER — TRAMADOL HCL 50 MG PO TABS: 50.0000 mg | ORAL_TABLET | Freq: Four times a day (QID) | ORAL | 0 refills | Status: AC | PRN

## 2018-03-03 MED ORDER — PROCHLORPERAZINE MALEATE 10 MG PO TABS: 10.0000 mg | ORAL_TABLET | Freq: Four times a day (QID) | ORAL | 0 refills | Status: AC | PRN

## 2018-03-03 MED ORDER — FOLIC ACID 1 MG PO TABS: 1.0000 mg | ORAL_TABLET | Freq: Every day | ORAL | 2 refills | Status: AC

## 2018-03-03 NOTE — Telephone Encounter (Signed)
Pain -persistent L shoulder pain . She said she has only used 4 percocet since it was filled 10-/21. She took a Ambulance person at 0730 this am and states it does not work.  I ask for a pill count and she said she cannot open the bottle. Someone will be back in an hour and can get the pill count.

## 2018-03-03 NOTE — Telephone Encounter (Signed)
Called to Rhonda Meyers-unable to reach pt .

## 2018-03-03 NOTE — Patient Outreach (Signed)
[facility] [facility]) Care Management  03/03/2018  Rhonda Meyers [birth year] [id number]   Subjective: Telephone call to patient's home / mobile number, spoke with patient, and HIPAA verified.  Discussed [facility] Care Management [facility] Transition of care follow up, patient voiced understanding, and is in agreement to follow up.   Patient states she is doing okay, chest pain blinded sided her, initial pain medications were not working for her pain prior to hospital admission, she was not aware of all the side effects of the radiation treatments, and is planning to schedule sooner appointment with oncologist to discuss treatment options.  States she has a follow up appointment with oncologist on 03/26/18.  States she was not able to attend chemo therapy class on 03/02/18 because she has a lot going on, and is planning to reschedule in the near future.  States she has hospital follow up appointment with primary MD (Dr. Abelino Derrick) this morning. Patient states she is able to manage self care and has assistance as needed.   Patient voices understanding of medical diagnosis and treatment plan. States she is accessing the following [facility] benefits: outpatient pharmacy, hospital indemnity (has benefit, patient request contact number on next contact with RNCM), and will discuss family medical leave act (FMLA) process with RNCM on next contact.  RNCM advised patient to follow up with [place] regarding community resources as needed, patient voiced understanding, and states she will follow up as needed.   Patient states she does not have any education material, transition of care, care coordination, disease management, disease monitoring, transportation, community resource, or pharmacy needs at this time. States she is very appreciative of the follow up and is in agreement to receive [place] Management information.   Patient states she has to get ready for MD appointment and requested call back at a  later time  to obtain contact information to follow up [facility] benefits with [employer] and [employer].       Objective: Per KPN (Knowledge Performance Now, point of care tool) and chart review, patient hospitalized 02/27/18 -03/02/18 for Atypical chest pain, musculoskeletal, bony metastasis, left upper lung malignancy related, ruled out for acute coronary syndrome.    Patient has a history of Stage IV lung adenocarcinoma, metastasis to the liver, adrenal and bone, Tobacco abuse ( with smoking cessation), Mediastinal adenopathy, pulmonary embolism.   Concluded yesterday 11 treatments of radiation therapy on 02/26/18.     Assessment: Received [facility] Transition of care referral on 03/02/18.   Transition of care follow up pending patient contact.       Plan: RNCM will send patient successful outreach letter, [facility] pamphlet, and magnet. RNCM will call patient for 2nd telephone outreach attempt, transition of care follow up, and proceed with case closure, within 10 business days if no return call.      Halsey Hammen H. Annia Friendly, BSN, [place] Management [facility] Telephonic CM Phone: [phone] Fax: [phone]

## 2018-03-03 NOTE — Telephone Encounter (Signed)
Pain management-Pt state she has 24 tablets of percocet left. She is not taking them because they "do not touch my pain in my shoulders". Per Julien Nordmann I called in tramadol. 1st chemo Friday- Per Maggie , pt does not have folic acid or antiemetic. Per Julien Nordmann, rx send for , folic  acid and compazine.

## 2018-03-04 ENCOUNTER — Inpatient Hospital Stay (HOSPITAL_BASED_OUTPATIENT_CLINIC_OR_DEPARTMENT_OTHER): Payer: 59 | Admitting: Medical

## 2018-03-04 ENCOUNTER — Other Ambulatory Visit: Payer: 59

## 2018-03-04 ENCOUNTER — Ambulatory Visit: Payer: 59

## 2018-03-04 ENCOUNTER — Telehealth: Payer: Self-pay | Admitting: Medical Oncology

## 2018-03-04 ENCOUNTER — Other Ambulatory Visit: Payer: Self-pay | Admitting: *Deleted

## 2018-03-04 ENCOUNTER — Inpatient Hospital Stay: Payer: 59

## 2018-03-04 DIAGNOSIS — R634 Abnormal weight loss: Secondary | ICD-10-CM | POA: Diagnosis not present

## 2018-03-04 DIAGNOSIS — R630 Anorexia: Secondary | ICD-10-CM

## 2018-03-04 DIAGNOSIS — G893 Neoplasm related pain (acute) (chronic): Secondary | ICD-10-CM | POA: Diagnosis not present

## 2018-03-04 DIAGNOSIS — C3402 Malignant neoplasm of left main bronchus: Secondary | ICD-10-CM

## 2018-03-04 DIAGNOSIS — C3492 Malignant neoplasm of unspecified part of left bronchus or lung: Secondary | ICD-10-CM

## 2018-03-04 LAB — CBC WITH DIFFERENTIAL (CANCER CENTER ONLY)
Abs Immature Granulocytes: 0.06 10*3/uL (ref 0.00–0.07)
BASOS PCT: 0 %
Basophils Absolute: 0 10*3/uL (ref 0.0–0.1)
EOS ABS: 0.2 10*3/uL (ref 0.0–0.5)
EOS PCT: 3 %
HCT: 43.1 % (ref 36.0–46.0)
Hemoglobin: 13.9 g/dL (ref 12.0–15.0)
IMMATURE GRANULOCYTES: 1 %
Lymphocytes Relative: 7 %
Lymphs Abs: 0.6 10*3/uL — ABNORMAL LOW (ref 0.7–4.0)
MCH: 26.9 pg (ref 26.0–34.0)
MCHC: 32.3 g/dL (ref 30.0–36.0)
MCV: 83.5 fL (ref 80.0–100.0)
MONO ABS: 1.1 10*3/uL — AB (ref 0.1–1.0)
MONOS PCT: 12 %
NEUTROS PCT: 77 %
Neutro Abs: 6.9 10*3/uL (ref 1.7–7.7)
PLATELETS: 150 10*3/uL (ref 150–400)
RBC: 5.16 MIL/uL — ABNORMAL HIGH (ref 3.87–5.11)
RDW: 14.6 % (ref 11.5–15.5)
WBC Count: 8.9 10*3/uL (ref 4.0–10.5)
nRBC: 0 % (ref 0.0–0.2)

## 2018-03-04 LAB — CMP (CANCER CENTER ONLY)
ALT: 12 U/L (ref 0–44)
ANION GAP: 10 (ref 5–15)
AST: 14 U/L — ABNORMAL LOW (ref 15–41)
Albumin: 2.8 g/dL — ABNORMAL LOW (ref 3.5–5.0)
Alkaline Phosphatase: 112 U/L (ref 38–126)
BUN: 12 mg/dL (ref 6–20)
CHLORIDE: 98 mmol/L (ref 98–111)
CO2: 29 mmol/L (ref 22–32)
Calcium: 9.3 mg/dL (ref 8.9–10.3)
Creatinine: 0.75 mg/dL (ref 0.44–1.00)
GFR, Est AFR Am: >60 mL/min (ref >60)
GLUCOSE: 93 mg/dL (ref 70–99)
POTASSIUM: 3.5 mmol/L (ref 3.5–5.1)
Sodium: 137 mmol/L (ref 135–145)
Total Bilirubin: 0.6 mg/dL (ref 0.3–1.2)
Total Protein: 7.2 g/dL (ref 6.5–8.1)

## 2018-03-04 LAB — TSH: TSH: 1.734 u[IU]/mL (ref 0.308–3.960)

## 2018-03-04 MED ORDER — OXYCODONE-ACETAMINOPHEN 5-325 MG PO TABS: 1.0000 | ORAL_TABLET | Freq: Three times a day (TID) | ORAL | 0 refills | Status: AC | PRN

## 2018-03-04 MED ORDER — FENTANYL 50 MCG/HR TD PT72: 50.0000 ug | MEDICATED_PATCH | TRANSDERMAL | 0 refills | Status: AC

## 2018-03-04 MED ORDER — MIRTAZAPINE 15 MG PO TABS: 15.0000 mg | ORAL_TABLET | Freq: Every day | ORAL | 2 refills | Status: AC

## 2018-03-04 NOTE — Telephone Encounter (Addendum)
I called Christia Reading ( Her cousin) back regarding his earlier call about pt in pain. He was yelling at me on the phone that her pain management is not working.  I attempted to call pt but no answer and mailbox full. Appt given for today at 1300 at [facility] and confirmed with [facility].

## 2018-03-04 NOTE — Patient Outreach (Signed)
[place] [facility]) Care Management  03/04/2018  Codie Hainer March 14, 1961 [id number]   Subjective: Telephone call to patient's home  / mobile number, no answer, voicemail is full, and unable to leave a message.     Objective: Per KPN (Knowledge Performance Now, point of care tool) and chart review, patient hospitalized 02/27/18 -03/02/18 for Atypical chest pain, musculoskeletal,bony metastasis,left upper lung malignancy related, ruledout for acute coronary syndrome.    Patient has a history of Stage IV lung adenocarcinoma, metastasis to the liver, adrenal and bone, Tobacco abuse ( with smoking cessation), Mediastinal adenopathy, pulmonary embolism. Concluded yesterday 11 treatments of radiation therapy on 02/26/18.      Assessment: Received [facility]MR Transition of care referral on 03/02/18.   Transition of care follow up to give requested resource information per patient request , pending patient contact.       Plan: RNCM has sent  patient successful outreach letter, [facility] pamphlet, and magnet. RNCM will call patient for 3rd telephone outreach attempt, transition of care follow up, and proceed with case closure, within 10 business days if no return call.     Airam Runions H. Annia Friendly, BSN, [place] Management [facility] Telephonic CM Phone: [phone] Fax: [phone]

## 2018-03-04 NOTE — Progress Notes (Signed)
Pt presents with chronic L shoulder pain without relief by pain medication.  Per cousin she hasn't been able to move or perform activities d/t the pain.  Per relative "we just want her to have some sense of normalcy by controlling this pain, and I feel that she isn't getting the standard of care as an older African American woman".  Cousin states "I've been calm long enough, now I'm just angry and tired of you people not taking care of her.  The tramadol isn't working and neither is the percocet.  It wasn't right for the hospital to discharge her on only percocet after she was receiving IV morphine."  RN Learta Codding and PA Lucianne Lei discussed pain management options with pt and her relative who both verbalized understanding.

## 2018-03-04 NOTE — Patient Instructions (Signed)

## 2018-03-05 ENCOUNTER — Ambulatory Visit: Payer: Self-pay | Admitting: Physician Assistant

## 2018-03-05 ENCOUNTER — Other Ambulatory Visit: Payer: Self-pay | Admitting: *Deleted

## 2018-03-05 ENCOUNTER — Other Ambulatory Visit: Payer: 59

## 2018-03-05 ENCOUNTER — Inpatient Hospital Stay: Payer: 59

## 2018-03-05 ENCOUNTER — Encounter

## 2018-03-05 ENCOUNTER — Telehealth: Payer: Self-pay | Admitting: Internal Medicine

## 2018-03-05 ENCOUNTER — Telehealth: Payer: Self-pay

## 2018-03-05 NOTE — Telephone Encounter (Signed)
R/s appt per 10/31 sch message - pt is aware of appt date and time.

## 2018-03-05 NOTE — Telephone Encounter (Signed)
error 

## 2018-03-05 NOTE — Patient Outreach (Signed)
[place] [facility]) Care Management  03/05/2018  Rhonda Meyers February 12, 1961 [id number]   Subjective: Telephone call to patient's home  / mobile number, no answer, voicemail is full, and unable to leave a message.     Objective:Per KPN (Knowledge Performance Now, point of care tool) and chart review,patient hospitalized 02/27/18 -03/02/18 forAtypical chest pain, musculoskeletal,bony metastasis,left upper lung malignancy related, ruledout for acute coronary syndrome. Patient has a history of Stage IV lung adenocarcinoma,metastasis to the liver, adrenal and bone,Tobacco abuse( with smoking cessation),Mediastinal adenopathy,pulmonary embolism. Concluded yesterday 11 treatments of radiation therapyon 02/26/18.      Assessment: Received UMR Transition of care referral on 03/02/18.Transition of care follow up to give requested resource information per patient request , pending patient contact.      Plan:RNCM has sent  patient successful outreach letter, [facility] pamphlet, and magnet. RNCM will call patient for 4th telephone outreach attempt, transition of care follow up, and proceed with case closure, within 10 business days if no return call.     Adeeb Konecny H. Annia Friendly, BSN, [place] [facility] Telephonic CM Phone: [phone] Fax: [phone]

## 2018-03-06 ENCOUNTER — Other Ambulatory Visit: Payer: Self-pay | Admitting: *Deleted

## 2018-03-06 NOTE — Patient Outreach (Addendum)
[facility] [facility]) Care Management  03/06/2018  Rhonda Meyers 1960-09-18 [id number]   Subjective:Telephone call to patient's home / mobile number, no answer, voicemail is full, and unable to leave a message.     Objective:Per KPN (Knowledge Performance Now, point of care tool) and chart review,patient hospitalized 02/27/18 -03/02/18 forAtypical chest pain, musculoskeletal,bony metastasis,left upper lung malignancy related, ruledout for acute coronary syndrome. Patient has a history of Stage IV lung adenocarcinoma,metastasis to the liver, adrenal and bone,Tobacco abuse( with smoking cessation),Mediastinal adenopathy,pulmonary embolism. Concluded yesterday 11 treatments of radiation therapyon 02/26/18.      Assessment: Received UMR Transition of care referral on 03/02/18.Transition of care follow upto give requested resource information per patient request ,pending patient contact.      Plan:RNCMwill send patient resource letter with the following information per patient's request: (contact number for [employer] [phone], patient to contact regarding hospital indemnity claim, will file claim if appropriate,  contact number for [facility] [phone] to request itemized bill,  contact number for [employer] [phone], patient will follow up with [employer] regarding FMLA [facility] Leave Act).    RNCM will proceed with case closure, within 10 business days if no return call.      Rhonda Meyers H. Annia Friendly, BSN, [place] [facility] Telephonic CM Phone: [phone] Fax: [phone]

## 2018-03-09 ENCOUNTER — Ambulatory Visit: Payer: 59 | Admitting: Radiation Oncology

## 2018-03-09 ENCOUNTER — Inpatient Hospital Stay: Payer: 59

## 2018-03-10 ENCOUNTER — Telehealth: Payer: Self-pay | Admitting: *Deleted

## 2018-03-10 NOTE — Telephone Encounter (Signed)
Returned call to [employer] who lmovm requesting pt to be on hospice. Reviewed with MD, notified TIm via VM we have not given pt any active treatment, PCP will need to refer pt to Hospice or family member can call hospice. Request for Tim to call back with concerns.

## 2018-03-11 ENCOUNTER — Encounter: Payer: Self-pay | Admitting: Medical

## 2018-03-11 ENCOUNTER — Telehealth: Payer: Self-pay | Admitting: *Deleted

## 2018-03-11 ENCOUNTER — Encounter: Payer: Self-pay | Admitting: Internal Medicine

## 2018-03-11 DIAGNOSIS — C3492 Malignant neoplasm of unspecified part of left bronchus or lung: Secondary | ICD-10-CM

## 2018-03-11 NOTE — Telephone Encounter (Signed)
Monya Kozakiewicz (family member) called requesting pt be admitted to Hospice. Reviewed with MD, who advised he is unable to be attending as he has not referred her to Hospice and it was our understanding that she would be starting treatment in the next week.   Pt will need PCP to refer and be attending. Discussed above with Tim who advised pt does not have a PCP.  Called Hospice upon family request to assist with this process. Hospice advised they can provide an attending for pt in this type of situation.  Returned call to [employer], instructed him to call Hospice and discuss options. Pt/family are allowed to self refer  I asked if pt was available to speak with, Tim advised he was at work and she does not answer the phone at the house.  Called pt mobile/home# to confirm if pt is proceeding with treatment. No answer.

## 2018-03-11 NOTE — Progress Notes (Signed)
Symptoms Management Clinic Progress Note   Rhonda Meyers [id number] Sep 16, 1960 57 y.o.  Rhonda Meyers is managed by Dr. Fanny Bien. Mohamed  Actively treated with chemotherapy/immunotherapy: no  Current Therapy: Awaiting start of carboplatin Keytruda and Alimta  Assessment: Plan:    Anorexia - Plan: mirtazapine (REMERON) 15 MG tablet  Malignant neoplasm of left lung stage 4 (HCC)  Cancer related pain - Plan: fentaNYL (DURAGESIC) 50 MCG/HR, oxyCODONE-acetaminophen (PERCOCET) 5-325 MG tablet   Anorexia: The patient has been given a prescription for Remeron 15 mg p.o. Nightly.  Plans are to ask dietitian to see this patient tomorrow.  Metastatic malignant neoplasm of the left lung: The patient has agreed to return in the morning to begin cycle 1 of carboplatin, Keytruda, and Alimta.  Cancer related pain: After an in-depth discussion with Dr. Julien Nordmann it was agreed to begin the patient on Duragesic 50 mcg patches continue Percocet 5-3 25 for breakthrough pain.  A prior authorization was completed by this provider this afternoon and prescriptions were given to the patient for both of these medications.  The patient is agreeable to consider home physical therapy once her pain is better controlled.  Please see After Visit Summary for patient specific instructions.  Future Appointments  Date Time Provider [place]  03/12/2018  3:45 PM [facility] LAB 1 [facility] None  03/19/2018  3:45 PM [facility] 1 [facility] None  03/26/2018 11:15 AM [facility] 1 [facility] None  03/26/2018 11:45 AM Curt Bears, MD [facility] None  03/26/2018 12:00 PM [facility] LAB 3 [facility] None  03/26/2018 12:30 PM [facility] INFUSION [facility] None  04/03/2018  3:45 PM [facility] LAB 6 [facility] None  04/06/2018  1:30 PM Hayden Pedro, PA-C [facility] None  04/10/2018  3:45 PM [facility] LAB 6 [facility] None  05/26/2018  4:30 PM Icard, Leory Plowman L, DO  [facility] None    No orders of the defined types were placed in this encounter.      Subjective:   Patient ID:  Rhonda Meyers is a 57 y.o. (DOB August 04, 1960) female.  Chief Complaint:  Chief Complaint  Patient presents with  . Shoulder Pain    HPI Rhonda Meyers is a 57 year old female with a stage IV (T3, N3, M1c) non-small cell lung cancer, adenocarcinoma presented with obstructive left lung mass, left hilar and mediastinal lymphadenopathy as well as metastatic disease to the liver, adrenal and bone which was diagnosed in September 2019.  The patient presents to the clinic today with her cousin.  They are here today to discuss pain issues.  The patient had been on Percocet and was recently hospitalized on 02/27/2018 for a possible MI.  She was given morphine for pain control while she was hospitalized and was discharged home on Percocet.  She was also given a prescription for tramadol by Dr. Fanny Bien. Mohamed.  The patient's cousin indicates that he believes that the patient is being undertreated for her pain due to her ethnicity and her sex.  Dr. Julien Nordmann reviewed the patient's last visit at which time she reported that she was having no pain.  He explained the difficulties in managing someone's pain when they had reported that they had 9.  He explains that he is willing to attempt to help this patient and manage their pain.  He also stated that it may become difficult to manage her pain at which time he would place a referral to pain management.  Given the patient's cousin works in Biomedical engineer and knows many of the neurologist he  suggested that the patient might be seen by Dr. Maryjean Ka.  The patient's cousin states that he was given advice from the neurologist regarding how the patient's pain should be managed.  Dr. Earlie Server reviewed his recommendations for management of the patient's pain with the patient and her cousin.  They are willing to try the Duragesic patches and oral  breakthrough opioids.  Dr. Fanny Bien. Mohamed explained to the patient that he needs to begin treating her cancer in hopes that it will help to control her pain.  The patient states that she is willing to return tomorrow to begin chemotherapy. Ms. Willhelm states that it is difficult to lift her left arm due to her pain.  Her range of motion is limited by this.  Medications: I have reviewed the patient's current medications.  Allergies: No Known Allergies  Past Medical History:  Diagnosis Date  . Cancer ([place])   . Dyspnea    WIth activity  . Hilar adenopathy 01/25/2018  . Lung mass 01/25/2018  . Mediastinal adenopathy  01/25/2018  . Tobacco abuse     Past Surgical History:  Procedure Laterality Date  . NO PAST SURGERIES    . VIDEO BRONCHOSCOPY WITH ENDOBRONCHIAL ULTRASOUND N/A 01/28/2018   Procedure: VIDEO BRONCHOSCOPY WITH ENDOBRONCHIAL ULTRASOUND;  Surgeon: Garner Nash, DO;  Location: [facility] OR;  Service: Thoracic;  Laterality: N/A;    Family History  Problem Relation Age of Onset  . Cancer Maternal Grandmother 77       breast  . Rheumatic fever Mother   . CAD Mother 4       MI  . Hypertension Sister   . Diabetes Neg Hx   . Stroke Neg Hx     Social History   Socioeconomic History  . Marital status: Single    Spouse name: Not on file  . Number of children: Not on file  . Years of education: Not on file  . Highest education level: Not on file  Occupational History  . Not on file  Social Needs  . Financial resource strain: Not on file  . Food insecurity:    Worry: Not on file    Inability: Not on file  . Transportation needs:    Medical: No    Non-medical: No  Tobacco Use  . Smoking status: Former Smoker    Packs/day: 0.50    Years: 30.00    Pack years: 15.00    Types: Cigarettes    Start date: 05/06/1981    Last attempt to quit: 01/17/2018    Years since quitting: 0.1  . Smokeless tobacco: Never Used  Substance and Sexual Activity  . Alcohol use: Not  Currently    Comment: 1 bottle wine on weekends  . Drug use: No  . Sexual activity: Not on file  Lifestyle  . Physical activity:    Days per week: Not on file    Minutes per session: Not on file  . Stress: Not on file  Relationships  . Social connections:    Talks on phone: Not on file    Gets together: Not on file    Attends religious service: Not on file    Active member of club or organization: Not on file    Attends meetings of clubs or organizations: Not on file    Relationship status: Not on file  . Intimate partner violence:    Fear of current or ex partner: Not on file    Emotionally abused: Not on file  Physically abused: Not on file    Forced sexual activity: Not on file  Other Topics Concern  . Not on file  Social History Narrative   Lives alone, no pets   Occupation: housekeeper at [employer]   Activity: regular walking   Diet: good water, fruit/svegetables daily    Past Medical History, Surgical history, Social history, and Family history were reviewed and updated as appropriate.   Please see review of systems for further details on the patient's review from today.   Review of Systems:  Review of Systems  Constitutional: Negative for chills, diaphoresis and fever.  HENT: Negative for trouble swallowing and voice change.   Respiratory: Negative for cough, chest tightness, shortness of breath and wheezing.   Cardiovascular: Negative for chest pain and palpitations.  Gastrointestinal: Negative for abdominal pain, constipation, diarrhea, nausea and vomiting.  Musculoskeletal: Positive for arthralgias. Negative for back pain and myalgias.  Neurological: Negative for dizziness, light-headedness and headaches.    Objective:   Physical Exam:  LMP 09/03/2013  ECOG: 1  Physical Exam  Constitutional: No distress.  HENT:  Head: Normocephalic and atraumatic.  Right Ear: External ear normal.  Left Ear: External ear normal.  Mouth/Throat: Oropharynx is clear  and moist. No oropharyngeal exudate.  Neck: Normal range of motion. Neck supple.  Cardiovascular: Normal rate, regular rhythm and normal heart sounds. Exam reveals no gallop and no friction rub.  No murmur heard. Pulmonary/Chest: Effort normal and breath sounds normal. No respiratory distress. She has no wheezes. She has no rales.  Musculoskeletal: She exhibits tenderness (Tenderness over the anterior and posterior left shoulder.).  The patient is guarding her left shoulder and has limited range of motion due to her pain.  Lymphadenopathy:    She has no cervical adenopathy.  Neurological: She is alert. Coordination normal.  Skin: Skin is warm and dry. No rash noted. She is not diaphoretic. No erythema.    Lab Review:     Component Value Date/Time   NA 137 03/04/2018 1348   K 3.5 03/04/2018 1348   CL 98 03/04/2018 1348   CO2 29 03/04/2018 1348   GLUCOSE 93 03/04/2018 1348   BUN 12 03/04/2018 1348   CREATININE 0.75 03/04/2018 1348   CALCIUM 9.3 03/04/2018 1348   PROT 7.2 03/04/2018 1348   ALBUMIN 2.8 (L) 03/04/2018 1348   AST 14 (L) 03/04/2018 1348   ALT 12 03/04/2018 1348   ALKPHOS 112 03/04/2018 1348   BILITOT 0.6 03/04/2018 1348   GFRNONAA >60 03/04/2018 1348   GFRAA >60 03/04/2018 1348       Component Value Date/Time   WBC 8.9 03/04/2018 1348   WBC 10.1 03/02/2018 0451   RBC 5.16 (H) 03/04/2018 1348   HGB 13.9 03/04/2018 1348   HCT 43.1 03/04/2018 1348   PLT 150 03/04/2018 1348   MCV 83.5 03/04/2018 1348   MCH 26.9 03/04/2018 1348   MCHC 32.3 03/04/2018 1348   RDW 14.6 03/04/2018 1348   LYMPHSABS 0.6 (L) 03/04/2018 1348   MONOABS 1.1 (H) 03/04/2018 1348   EOSABS 0.2 03/04/2018 1348   BASOSABS 0.0 03/04/2018 1348   -------------------------------  Imaging from last 24 hours (if applicable):  Radiology interpretation: Dg Chest 2 View  Result Date: 02/27/2018 CLINICAL DATA:  Chest pain; hx lung ca; former smoker EXAM: CHEST - 2 VIEW COMPARISON:  Chest CT on  02/09/2018 CT chest x-ray on 02/09/2018 FINDINGS: There has been significant improvement in aeration of the LEFT lung. The LEFT hilar region is enlarged,  consistent with known malignancy. There is no pulmonary edema. Small RIGHT pleural effusion or pleural thickening noted. Visualized osseous structures have a normal appearance. IMPRESSION: Significantly improved aeration in the LEFT lung. LEFT hilar mass. Electronically Signed   By: Nolon Nations M.D.   On: 02/27/2018 13:55   Dg Chest 2 View  Result Date: 02/09/2018 CLINICAL DATA:  Stage IV lung the ligament C. New onset hemoptysis this morning associated with shortness of breath and posterior chest pain. Former smoker. EXAM: CHEST - 2 VIEW COMPARISON:  CT scan of the chest of February 04, 2018 and PA and lateral chest x-ray of January 17, 2018 FINDINGS: Opacification of the left hemithorax is nearly total similar to that seen on the recent CT scan. There is some shift of the mediastinum toward the left. The right lung is well-expanded and clear. The observed bony thorax is unremarkable. IMPRESSION: Near total atelectasis of the left lung with large left pleural effusion consistent with a central obstructing lesion. And mild shift of the mediastinum from right to left. Clear right lung. Electronically Signed   By: David  Martinique M.D.   On: 02/09/2018 10:57   Ct Angio Chest Pe W And/or Wo Contrast  Result Date: 02/27/2018 CLINICAL DATA:  Chest pain.  History of lung cancer. EXAM: CT ANGIOGRAPHY CHEST WITH CONTRAST TECHNIQUE: Multidetector CT imaging of the chest was performed using the standard protocol during bolus administration of intravenous contrast. Multiplanar CT image reconstructions and MIPs were obtained to evaluate the vascular anatomy. CONTRAST:  129mL ISOVUE-370 IOPAMIDOL (ISOVUE-370) INJECTION 76% COMPARISON:  CT scan of February 09, 2018. FINDINGS: Cardiovascular: Satisfactory opacification of the pulmonary arteries to the segmental level. No  evidence of pulmonary embolism. Normal heart size. No pericardial effusion. Nonocclusive embolus seen in lower lobe branch of right pulmonary artery on prior exam is no longer visualized. Mediastinum/Nodes: Thyroid gland and esophagus are unremarkable. 3.7 cm subcarinal lymph node is noted which is slightly decreased compared to prior exam. 15 mm left hilar lymph node is noted. Lungs/Pleura: No pneumothorax or pleural effusion is noted. 3.9 x 2.7 cm mass is noted in the superior segment of left lower lobe consistent with malignancy. 16 mm nodule is noted posteriorly in left upper lobe consistent with metastatic disease. Mild left basilar atelectasis is noted. Minimal right posterior basilar subsegmental atelectasis is noted. 19 x 15 mm cavitary lesion is noted in left upper lobe concerning for malignancy. Upper Abdomen: Stable right adrenal nodule is noted. No other abnormality seen in visualized portion of upper abdomen. Musculoskeletal: No chest wall abnormality. No acute or significant osseous findings. Review of the MIP images confirms the above findings. IMPRESSION: No definite evidence of pulmonary embolus is noted currently. Nonocclusive thrombus seen in lower lobe branch of right pulmonary artery on prior exam is no longer visualized. 3.7 cm subcarinal lymph node is noted which is slightly decreased in size compared to prior exam. 1.5 cm left hilar lymph node is noted which could not be visualized on prior exam due to atelectasis and effusion. Left pleural effusion noted on prior exam has resolved. 3.9 x 2.7 cm mass is noted in the superior segment of left lower lobe consistent with malignancy. 16 mm nodule is noted posteriorly in left upper lobe consistent with metastatic disease. Also noted is 19 mm cavitary lesion in left upper lobe concerning for metastatic disease. Stable right adrenal nodule. Electronically Signed   By: Marijo Conception, M.D.   On: 02/27/2018 15:39   Ct Angio Chest  Pe W And/or Wo  Contrast  Result Date: 02/09/2018 CLINICAL DATA:  Hemoptysis, shortness of breath EXAM: CT ANGIOGRAPHY CHEST WITH CONTRAST TECHNIQUE: Multidetector CT imaging of the chest was performed using the standard protocol during bolus administration of intravenous contrast. Multiplanar CT image reconstructions and MIPs were obtained to evaluate the vascular anatomy. CONTRAST:  124mL ISOVUE-370 IOPAMIDOL (ISOVUE-370) INJECTION 76% COMPARISON:  Chest x-ray 02/09/2018. Chest CT 01/17/2018. PET CT 02/04/2018. FINDINGS: Cardiovascular: Heart is normal size. Visualized aorta normal caliber. Filling defects are noted within right lower lobe pulmonary arterial branches compatible with nonocclusive pulmonary embolus. No evidence of right heart strain Mediastinum/Nodes: Bulky right AP window nodal mass again noted, measuring 4.2 cm compared to 3.6 cm on prior PET CT. Other enlarged mediastinal lymph nodes in the right paratracheal region, subcarinal region and prevascular regions. Lungs/Pleura: Complete drowned lung on the left again noted, stable since prior PET CT due to occlusion of the left mainstem bronchus related to tumor and adenopathy. Moderate left pleural effusion. Right lung is clear. Upper Abdomen: Right adrenal nodule noted, stable. Musculoskeletal: Chest wall soft tissues are unremarkable. Destructive lytic lesion noted in the left 12th medial rib at the costovertebral junction on image 119 of series 7. Central lucent lesion noted in the the T1 vertebral body compatible with metastasis. Review of the MIP images confirms the above findings. IMPRESSION: Drowned lung on the left due to obstructing tumor and adenopathy obstructing the left mainstem bronchus. Moderate left pleural effusion. Right lung clear. Nonocclusive pulmonary emboli noted in the right lower lobe pulmonary artery. No evidence of right heart strain. Bony metastatic disease involving the medial left 12th rib and the T1 vertebral body. These results were  called by telephone at the time of interpretation on 02/09/2018 at 11:32 am to Dr. Gareth Morgan , who verbally acknowledged these results. Electronically Signed   By: Rolm Baptise M.D.   On: 02/09/2018 11:33    ADDENDUM: Hematology/Oncology Attending: I had a face-to-face encounter with the patient.  I recommended her care plan.  This is a very pleasant 57 years old African-American female recently diagnosed with stage IV non-small cell lung cancer presented with drowned lung on the left secondary to obstructing tumor and adenopathy obstructing the left mainstem bronchus as well as left pleural effusion and metastatic disease to the bone.  The patient underwent palliative radiotherapy to the left lung mass as well as the bone metastasis.  She was supposed to start systemic chemotherapy with carboplatin, Alimta and Ketruda (pembrolizumab) but this was delayed because of her concern about pain management. Her cousin also work in the health system has been given wrong advice by work associate and was told to make a big deal about her pain management so he can get whatever he wants.  He was very inappropriate with the nurse and the staff and the cancer center today. During the last visit the patient was asked about her pain management and she mentioned that she is feeling fine with her previous pain medication.  They called few days later and mentioned that her current medication is not working and when to change it to tramadol he was not happy with the change and wanted stronger pain medications. I had a lengthy discussion with the patient today about her condition and her treatment options.  The patient is interested in proceeding with the treatment but the cousin mentions that she is too weak to start any treatment at this point. We adjusted her pain medication by ordering fentanyl  patch in addition to Percocet for breakthrough pain. She is expected to start the first dose of her treatment tomorrow but  there is reluctance from the cousin to bring the patient for treatment as planned. Other option were discussed including palliative care and hospice referral but no decision was made by the patient or her family member. We will see the patient for follow-up visit as previously planned and she was advised to call immediately if she has any other concerning symptoms in the interval.  Disclaimer: This note was dictated with voice recognition software. Similar sounding words can inadvertently be transcribed and may be missed upon review. Eilleen Kempf, MD 03/11/18

## 2018-03-11 NOTE — Progress Notes (Signed)
Called pt to introduce myself as her Arboriculturist and to discuss copay assistance.  I left a msg requesting she return my call at her earliest convenience, if not I'll plan to see her on 03/12/18.

## 2018-03-12 ENCOUNTER — Inpatient Hospital Stay: Payer: 59 | Attending: Internal Medicine

## 2018-03-12 ENCOUNTER — Telehealth: Payer: Self-pay | Admitting: Internal Medicine

## 2018-03-12 DIAGNOSIS — C787 Secondary malignant neoplasm of liver and intrahepatic bile duct: Secondary | ICD-10-CM | POA: Diagnosis not present

## 2018-03-12 DIAGNOSIS — R634 Abnormal weight loss: Secondary | ICD-10-CM | POA: Diagnosis not present

## 2018-03-12 DIAGNOSIS — R630 Anorexia: Secondary | ICD-10-CM | POA: Diagnosis not present

## 2018-03-12 DIAGNOSIS — R4182 Altered mental status, unspecified: Secondary | ICD-10-CM | POA: Diagnosis not present

## 2018-03-12 DIAGNOSIS — C3490 Malignant neoplasm of unspecified part of unspecified bronchus or lung: Secondary | ICD-10-CM | POA: Diagnosis not present

## 2018-03-12 DIAGNOSIS — C7800 Secondary malignant neoplasm of unspecified lung: Secondary | ICD-10-CM | POA: Diagnosis not present

## 2018-03-12 NOTE — Telephone Encounter (Signed)
Faxed medical records to [facility] at [phone], Release [id number]

## 2018-03-13 DIAGNOSIS — C7800 Secondary malignant neoplasm of unspecified lung: Secondary | ICD-10-CM | POA: Diagnosis not present

## 2018-03-13 DIAGNOSIS — C3490 Malignant neoplasm of unspecified part of unspecified bronchus or lung: Secondary | ICD-10-CM | POA: Diagnosis not present

## 2018-03-13 DIAGNOSIS — C787 Secondary malignant neoplasm of liver and intrahepatic bile duct: Secondary | ICD-10-CM | POA: Diagnosis not present

## 2018-03-13 DIAGNOSIS — R634 Abnormal weight loss: Secondary | ICD-10-CM | POA: Diagnosis not present

## 2018-03-13 DIAGNOSIS — R630 Anorexia: Secondary | ICD-10-CM | POA: Diagnosis not present

## 2018-03-13 DIAGNOSIS — R4182 Altered mental status, unspecified: Secondary | ICD-10-CM | POA: Diagnosis not present

## 2018-03-14 DIAGNOSIS — C7800 Secondary malignant neoplasm of unspecified lung: Secondary | ICD-10-CM | POA: Diagnosis not present

## 2018-03-14 DIAGNOSIS — R4182 Altered mental status, unspecified: Secondary | ICD-10-CM | POA: Diagnosis not present

## 2018-03-14 DIAGNOSIS — C3490 Malignant neoplasm of unspecified part of unspecified bronchus or lung: Secondary | ICD-10-CM | POA: Diagnosis not present

## 2018-03-14 DIAGNOSIS — R630 Anorexia: Secondary | ICD-10-CM | POA: Diagnosis not present

## 2018-03-14 DIAGNOSIS — C787 Secondary malignant neoplasm of liver and intrahepatic bile duct: Secondary | ICD-10-CM | POA: Diagnosis not present

## 2018-03-14 DIAGNOSIS — R634 Abnormal weight loss: Secondary | ICD-10-CM | POA: Diagnosis not present

## 2018-03-15 DIAGNOSIS — R634 Abnormal weight loss: Secondary | ICD-10-CM | POA: Diagnosis not present

## 2018-03-15 DIAGNOSIS — C787 Secondary malignant neoplasm of liver and intrahepatic bile duct: Secondary | ICD-10-CM | POA: Diagnosis not present

## 2018-03-15 DIAGNOSIS — C7800 Secondary malignant neoplasm of unspecified lung: Secondary | ICD-10-CM | POA: Diagnosis not present

## 2018-03-15 DIAGNOSIS — C3490 Malignant neoplasm of unspecified part of unspecified bronchus or lung: Secondary | ICD-10-CM | POA: Diagnosis not present

## 2018-03-15 DIAGNOSIS — R630 Anorexia: Secondary | ICD-10-CM | POA: Diagnosis not present

## 2018-03-15 DIAGNOSIS — R4182 Altered mental status, unspecified: Secondary | ICD-10-CM | POA: Diagnosis not present

## 2018-03-16 ENCOUNTER — Ambulatory Visit: Payer: 59 | Admitting: Radiation Oncology

## 2018-03-16 DIAGNOSIS — R4182 Altered mental status, unspecified: Secondary | ICD-10-CM | POA: Diagnosis not present

## 2018-03-16 DIAGNOSIS — C7800 Secondary malignant neoplasm of unspecified lung: Secondary | ICD-10-CM | POA: Diagnosis not present

## 2018-03-16 DIAGNOSIS — R630 Anorexia: Secondary | ICD-10-CM | POA: Diagnosis not present

## 2018-03-16 DIAGNOSIS — R634 Abnormal weight loss: Secondary | ICD-10-CM | POA: Diagnosis not present

## 2018-03-16 DIAGNOSIS — C3490 Malignant neoplasm of unspecified part of unspecified bronchus or lung: Secondary | ICD-10-CM | POA: Diagnosis not present

## 2018-03-16 DIAGNOSIS — C787 Secondary malignant neoplasm of liver and intrahepatic bile duct: Secondary | ICD-10-CM | POA: Diagnosis not present

## 2018-03-17 ENCOUNTER — Other Ambulatory Visit: Payer: Self-pay | Admitting: *Deleted

## 2018-03-17 DIAGNOSIS — C7800 Secondary malignant neoplasm of unspecified lung: Secondary | ICD-10-CM | POA: Diagnosis not present

## 2018-03-17 DIAGNOSIS — C3490 Malignant neoplasm of unspecified part of unspecified bronchus or lung: Secondary | ICD-10-CM | POA: Diagnosis not present

## 2018-03-17 DIAGNOSIS — R630 Anorexia: Secondary | ICD-10-CM | POA: Diagnosis not present

## 2018-03-17 DIAGNOSIS — R634 Abnormal weight loss: Secondary | ICD-10-CM | POA: Diagnosis not present

## 2018-03-17 DIAGNOSIS — C787 Secondary malignant neoplasm of liver and intrahepatic bile duct: Secondary | ICD-10-CM | POA: Diagnosis not present

## 2018-03-17 DIAGNOSIS — R4182 Altered mental status, unspecified: Secondary | ICD-10-CM | POA: Diagnosis not present

## 2018-03-17 NOTE — Patient Outreach (Addendum)
[place] [facility]) Care Management  03/17/2018  Rhonda Meyers [birth year] [id number]   No response from patient outreach attempts to confirm receipt of resource information and will proceed with case closure.     Objective:Per KPN (Knowledge Performance Now, point of care tool) and chart review,patient hospitalized 02/27/18 -03/02/18 forAtypical chest pain, musculoskeletal,bony metastasis,left upper lung malignancy related, ruledout for acute coronary syndrome. Patient has a history of Stage IV lung adenocarcinoma,metastasis to the liver, adrenal and bone,Tobacco abuse( with smoking cessation),Mediastinal adenopathy,pulmonary embolism. Concluded yesterday 11 treatments of radiation therapyon 02/26/18.      Assessment: Received [facility] Transition of care referral on 03/02/18.Transition of care follow up completed, patient sent requested resource information,  no care management needs, and will proceed with case closure.       Plan:RNCMhas sent patient resource letter with the following information per patient's request: (contact number for [employer] [phone], patient to contact regarding hospital indemnity claim, will file claim if appropriate,  contact number for [facility] Patient [facility]ing [phone] to request itemized bill,  contact number for [employer] [phone], patient will follow up with [employer] regarding FMLA [facility] Leave Act).    RNCM has sent patient successful outreach letter, [facility] pamphlet, and magnet. RNCM will complete case closure due to follow up completed / no care management needs.       Chaim Gatley H. Annia Friendly, BSN, [place] Management [facility] Telephonic CM Phone: [phone] Fax: [phone]

## 2018-03-18 DIAGNOSIS — R4182 Altered mental status, unspecified: Secondary | ICD-10-CM | POA: Diagnosis not present

## 2018-03-18 DIAGNOSIS — C3490 Malignant neoplasm of unspecified part of unspecified bronchus or lung: Secondary | ICD-10-CM | POA: Diagnosis not present

## 2018-03-18 DIAGNOSIS — C787 Secondary malignant neoplasm of liver and intrahepatic bile duct: Secondary | ICD-10-CM | POA: Diagnosis not present

## 2018-03-18 DIAGNOSIS — R634 Abnormal weight loss: Secondary | ICD-10-CM | POA: Diagnosis not present

## 2018-03-18 DIAGNOSIS — C7800 Secondary malignant neoplasm of unspecified lung: Secondary | ICD-10-CM | POA: Diagnosis not present

## 2018-03-18 DIAGNOSIS — R630 Anorexia: Secondary | ICD-10-CM | POA: Diagnosis not present

## 2018-03-19 ENCOUNTER — Inpatient Hospital Stay: Payer: 59

## 2018-03-19 DIAGNOSIS — C787 Secondary malignant neoplasm of liver and intrahepatic bile duct: Secondary | ICD-10-CM | POA: Diagnosis not present

## 2018-03-19 DIAGNOSIS — R630 Anorexia: Secondary | ICD-10-CM | POA: Diagnosis not present

## 2018-03-19 DIAGNOSIS — C7800 Secondary malignant neoplasm of unspecified lung: Secondary | ICD-10-CM | POA: Diagnosis not present

## 2018-03-19 DIAGNOSIS — R634 Abnormal weight loss: Secondary | ICD-10-CM | POA: Diagnosis not present

## 2018-03-19 DIAGNOSIS — C3490 Malignant neoplasm of unspecified part of unspecified bronchus or lung: Secondary | ICD-10-CM | POA: Diagnosis not present

## 2018-03-19 DIAGNOSIS — R4182 Altered mental status, unspecified: Secondary | ICD-10-CM | POA: Diagnosis not present

## 2018-03-20 DIAGNOSIS — C3490 Malignant neoplasm of unspecified part of unspecified bronchus or lung: Secondary | ICD-10-CM | POA: Diagnosis not present

## 2018-03-20 DIAGNOSIS — R4182 Altered mental status, unspecified: Secondary | ICD-10-CM | POA: Diagnosis not present

## 2018-03-20 DIAGNOSIS — R634 Abnormal weight loss: Secondary | ICD-10-CM | POA: Diagnosis not present

## 2018-03-20 DIAGNOSIS — C787 Secondary malignant neoplasm of liver and intrahepatic bile duct: Secondary | ICD-10-CM | POA: Diagnosis not present

## 2018-03-20 DIAGNOSIS — C7800 Secondary malignant neoplasm of unspecified lung: Secondary | ICD-10-CM | POA: Diagnosis not present

## 2018-03-20 DIAGNOSIS — R630 Anorexia: Secondary | ICD-10-CM | POA: Diagnosis not present

## 2018-03-21 DIAGNOSIS — R4182 Altered mental status, unspecified: Secondary | ICD-10-CM | POA: Diagnosis not present

## 2018-03-21 DIAGNOSIS — C7800 Secondary malignant neoplasm of unspecified lung: Secondary | ICD-10-CM | POA: Diagnosis not present

## 2018-03-21 DIAGNOSIS — R630 Anorexia: Secondary | ICD-10-CM | POA: Diagnosis not present

## 2018-03-21 DIAGNOSIS — C3490 Malignant neoplasm of unspecified part of unspecified bronchus or lung: Secondary | ICD-10-CM | POA: Diagnosis not present

## 2018-03-21 DIAGNOSIS — R634 Abnormal weight loss: Secondary | ICD-10-CM | POA: Diagnosis not present

## 2018-03-21 DIAGNOSIS — C787 Secondary malignant neoplasm of liver and intrahepatic bile duct: Secondary | ICD-10-CM | POA: Diagnosis not present

## 2018-03-22 DIAGNOSIS — R630 Anorexia: Secondary | ICD-10-CM | POA: Diagnosis not present

## 2018-03-22 DIAGNOSIS — C3490 Malignant neoplasm of unspecified part of unspecified bronchus or lung: Secondary | ICD-10-CM | POA: Diagnosis not present

## 2018-03-22 DIAGNOSIS — R634 Abnormal weight loss: Secondary | ICD-10-CM | POA: Diagnosis not present

## 2018-03-22 DIAGNOSIS — R4182 Altered mental status, unspecified: Secondary | ICD-10-CM | POA: Diagnosis not present

## 2018-03-22 DIAGNOSIS — C7800 Secondary malignant neoplasm of unspecified lung: Secondary | ICD-10-CM | POA: Diagnosis not present

## 2018-03-22 DIAGNOSIS — C787 Secondary malignant neoplasm of liver and intrahepatic bile duct: Secondary | ICD-10-CM | POA: Diagnosis not present

## 2018-03-23 ENCOUNTER — Ambulatory Visit: Payer: 59 | Admitting: Radiation Oncology

## 2018-03-23 DIAGNOSIS — R634 Abnormal weight loss: Secondary | ICD-10-CM | POA: Diagnosis not present

## 2018-03-23 DIAGNOSIS — C787 Secondary malignant neoplasm of liver and intrahepatic bile duct: Secondary | ICD-10-CM | POA: Diagnosis not present

## 2018-03-23 DIAGNOSIS — R4182 Altered mental status, unspecified: Secondary | ICD-10-CM | POA: Diagnosis not present

## 2018-03-23 DIAGNOSIS — C7800 Secondary malignant neoplasm of unspecified lung: Secondary | ICD-10-CM | POA: Diagnosis not present

## 2018-03-23 DIAGNOSIS — R630 Anorexia: Secondary | ICD-10-CM | POA: Diagnosis not present

## 2018-03-23 DIAGNOSIS — C3490 Malignant neoplasm of unspecified part of unspecified bronchus or lung: Secondary | ICD-10-CM | POA: Diagnosis not present

## 2018-03-24 DIAGNOSIS — C3490 Malignant neoplasm of unspecified part of unspecified bronchus or lung: Secondary | ICD-10-CM | POA: Diagnosis not present

## 2018-03-24 DIAGNOSIS — R630 Anorexia: Secondary | ICD-10-CM | POA: Diagnosis not present

## 2018-03-24 DIAGNOSIS — R4182 Altered mental status, unspecified: Secondary | ICD-10-CM | POA: Diagnosis not present

## 2018-03-24 DIAGNOSIS — C7800 Secondary malignant neoplasm of unspecified lung: Secondary | ICD-10-CM | POA: Diagnosis not present

## 2018-03-24 DIAGNOSIS — C787 Secondary malignant neoplasm of liver and intrahepatic bile duct: Secondary | ICD-10-CM | POA: Diagnosis not present

## 2018-03-24 DIAGNOSIS — R634 Abnormal weight loss: Secondary | ICD-10-CM | POA: Diagnosis not present

## 2018-03-24 NOTE — Progress Notes (Signed)
  Radiation Oncology         [phone]) [phone] ________________________________  Name: Rhonda Meyers MRN: [id number]  Date: 02/23/2018  DOB: [birth year]  End of Treatment Note  Diagnosis:   57 y.o. female with Stage IV NSCLC, adenocarcinoma of the left lung    Indication for treatment:  Palliative       Radiation treatment dates:   02/10/2018 - 02/23/2018  Site/dose:   Left Chest / 30 Gy in 10 fractions  Beams/energy:   3D / 10X, 6X Photon  Narrative: The patient tolerated radiation treatment relatively well.  She experienced moderate fatigue and a poor appetite. She also reported having a persistent cough and shortness of breath and new onset sore throat. She denied any hemoptysis.  Plan: The patient has completed radiation treatment. Prescription for Tussionex given. The patient will return to radiation oncology clinic for routine followup in one month. I advised them to call or return sooner if they have any questions or concerns related to their recovery or treatment.  ------------------------------------------------  Jodelle Gross, MD, PhD  This document serves as a record of services personally performed by Kyung Rudd, MD. It was created on his behalf by Rae Lips, a trained medical scribe. The creation of this record is based on the scribe's personal observations and the provider's statements to them. This document has been checked and approved by the attending provider.

## 2018-03-25 DIAGNOSIS — C787 Secondary malignant neoplasm of liver and intrahepatic bile duct: Secondary | ICD-10-CM | POA: Diagnosis not present

## 2018-03-25 DIAGNOSIS — R4182 Altered mental status, unspecified: Secondary | ICD-10-CM | POA: Diagnosis not present

## 2018-03-25 DIAGNOSIS — R630 Anorexia: Secondary | ICD-10-CM | POA: Diagnosis not present

## 2018-03-25 DIAGNOSIS — C7800 Secondary malignant neoplasm of unspecified lung: Secondary | ICD-10-CM | POA: Diagnosis not present

## 2018-03-25 DIAGNOSIS — R634 Abnormal weight loss: Secondary | ICD-10-CM | POA: Diagnosis not present

## 2018-03-25 DIAGNOSIS — C3490 Malignant neoplasm of unspecified part of unspecified bronchus or lung: Secondary | ICD-10-CM | POA: Diagnosis not present

## 2018-03-26 ENCOUNTER — Inpatient Hospital Stay: Payer: 59

## 2018-03-26 ENCOUNTER — Inpatient Hospital Stay: Payer: 59 | Admitting: Internal Medicine

## 2018-03-26 DIAGNOSIS — R630 Anorexia: Secondary | ICD-10-CM | POA: Diagnosis not present

## 2018-03-26 DIAGNOSIS — C787 Secondary malignant neoplasm of liver and intrahepatic bile duct: Secondary | ICD-10-CM | POA: Diagnosis not present

## 2018-03-26 DIAGNOSIS — R4182 Altered mental status, unspecified: Secondary | ICD-10-CM | POA: Diagnosis not present

## 2018-03-26 DIAGNOSIS — R634 Abnormal weight loss: Secondary | ICD-10-CM | POA: Diagnosis not present

## 2018-03-26 DIAGNOSIS — C3490 Malignant neoplasm of unspecified part of unspecified bronchus or lung: Secondary | ICD-10-CM | POA: Diagnosis not present

## 2018-03-26 DIAGNOSIS — C7800 Secondary malignant neoplasm of unspecified lung: Secondary | ICD-10-CM | POA: Diagnosis not present

## 2018-03-27 DIAGNOSIS — R630 Anorexia: Secondary | ICD-10-CM | POA: Diagnosis not present

## 2018-03-27 DIAGNOSIS — C787 Secondary malignant neoplasm of liver and intrahepatic bile duct: Secondary | ICD-10-CM | POA: Diagnosis not present

## 2018-03-27 DIAGNOSIS — C7800 Secondary malignant neoplasm of unspecified lung: Secondary | ICD-10-CM | POA: Diagnosis not present

## 2018-03-27 DIAGNOSIS — R634 Abnormal weight loss: Secondary | ICD-10-CM | POA: Diagnosis not present

## 2018-03-27 DIAGNOSIS — R4182 Altered mental status, unspecified: Secondary | ICD-10-CM | POA: Diagnosis not present

## 2018-03-27 DIAGNOSIS — C3490 Malignant neoplasm of unspecified part of unspecified bronchus or lung: Secondary | ICD-10-CM | POA: Diagnosis not present

## 2018-03-28 DIAGNOSIS — R630 Anorexia: Secondary | ICD-10-CM | POA: Diagnosis not present

## 2018-03-28 DIAGNOSIS — R634 Abnormal weight loss: Secondary | ICD-10-CM | POA: Diagnosis not present

## 2018-03-28 DIAGNOSIS — C3490 Malignant neoplasm of unspecified part of unspecified bronchus or lung: Secondary | ICD-10-CM | POA: Diagnosis not present

## 2018-03-28 DIAGNOSIS — C7800 Secondary malignant neoplasm of unspecified lung: Secondary | ICD-10-CM | POA: Diagnosis not present

## 2018-03-28 DIAGNOSIS — C787 Secondary malignant neoplasm of liver and intrahepatic bile duct: Secondary | ICD-10-CM | POA: Diagnosis not present

## 2018-03-28 DIAGNOSIS — R4182 Altered mental status, unspecified: Secondary | ICD-10-CM | POA: Diagnosis not present

## 2018-03-28 NOTE — Progress Notes (Signed)
  Radiation Oncology         [phone] ________________________________  Name: Rhonda Meyers MRN: [id number]  Date: 02/09/2018  DOB: 10-28-1960  SIMULATION AND TREATMENT PLANNING NOTE  DIAGNOSIS:     ICD-10-CM   1. Malignant neoplasm of left main bronchus ([place]) C34.02      Site:  Left chest  NARRATIVE:  The patient was brought to the [facility].  Identity was confirmed.  All relevant records and images related to the planned course of therapy were reviewed.   Written consent to proceed with treatment was confirmed which was freely given after reviewing the details related to the planned course of therapy had been reviewed with the patient.  Then, the patient was set-up in a stable reproducible  supine position for radiation therapy.  CT images were obtained.  Surface markings were placed.     The CT images were loaded into the planning software.  Then the target and avoidance structures were contoured.  Treatment planning then occurred.  The radiation prescription was entered and confirmed.  A total of 5 complex treatment devices were fabricated which relate to the designed radiation treatment fields. Each of these customized fields/ complex treatment devices will be used on a daily basis during the radiation course. I have requested : 3D Simulation  I have requested a DVH of the following structures: Target volume, lungs, spinal cord.   The patient will undergo daily image guidance to ensure accurate localization of the target, and adequate minimize dose to the normal surrounding structures in close proximity to the target.   PLAN:  The patient will receive 30 Gy in 10 fractions.  Special treatment procedure The patient will also receive concurrent chemotherapy during the treatment. The patient may therefore experience increased toxicity or side effects and the patient will be monitored for such problems. This may require extra lab work as necessary. This  therefore constitutes a special treatment procedure.   ________________________________   Jodelle Gross, MD, PhD

## 2018-03-29 DIAGNOSIS — R634 Abnormal weight loss: Secondary | ICD-10-CM | POA: Diagnosis not present

## 2018-03-29 DIAGNOSIS — C7800 Secondary malignant neoplasm of unspecified lung: Secondary | ICD-10-CM | POA: Diagnosis not present

## 2018-03-29 DIAGNOSIS — C3490 Malignant neoplasm of unspecified part of unspecified bronchus or lung: Secondary | ICD-10-CM | POA: Diagnosis not present

## 2018-03-29 DIAGNOSIS — C787 Secondary malignant neoplasm of liver and intrahepatic bile duct: Secondary | ICD-10-CM | POA: Diagnosis not present

## 2018-03-29 DIAGNOSIS — R4182 Altered mental status, unspecified: Secondary | ICD-10-CM | POA: Diagnosis not present

## 2018-03-29 DIAGNOSIS — R630 Anorexia: Secondary | ICD-10-CM | POA: Diagnosis not present

## 2018-03-30 ENCOUNTER — Ambulatory Visit: Payer: 59 | Admitting: Radiation Oncology

## 2018-03-30 DIAGNOSIS — C7800 Secondary malignant neoplasm of unspecified lung: Secondary | ICD-10-CM | POA: Diagnosis not present

## 2018-03-30 DIAGNOSIS — C787 Secondary malignant neoplasm of liver and intrahepatic bile duct: Secondary | ICD-10-CM | POA: Diagnosis not present

## 2018-03-30 DIAGNOSIS — R634 Abnormal weight loss: Secondary | ICD-10-CM | POA: Diagnosis not present

## 2018-03-30 DIAGNOSIS — R4182 Altered mental status, unspecified: Secondary | ICD-10-CM | POA: Diagnosis not present

## 2018-03-30 DIAGNOSIS — R630 Anorexia: Secondary | ICD-10-CM | POA: Diagnosis not present

## 2018-03-30 DIAGNOSIS — C3490 Malignant neoplasm of unspecified part of unspecified bronchus or lung: Secondary | ICD-10-CM | POA: Diagnosis not present

## 2018-03-31 DIAGNOSIS — R630 Anorexia: Secondary | ICD-10-CM | POA: Diagnosis not present

## 2018-03-31 DIAGNOSIS — C7800 Secondary malignant neoplasm of unspecified lung: Secondary | ICD-10-CM | POA: Diagnosis not present

## 2018-03-31 DIAGNOSIS — C787 Secondary malignant neoplasm of liver and intrahepatic bile duct: Secondary | ICD-10-CM | POA: Diagnosis not present

## 2018-03-31 DIAGNOSIS — C3490 Malignant neoplasm of unspecified part of unspecified bronchus or lung: Secondary | ICD-10-CM | POA: Diagnosis not present

## 2018-03-31 DIAGNOSIS — R4182 Altered mental status, unspecified: Secondary | ICD-10-CM | POA: Diagnosis not present

## 2018-03-31 DIAGNOSIS — R634 Abnormal weight loss: Secondary | ICD-10-CM | POA: Diagnosis not present

## 2018-04-01 DIAGNOSIS — C7800 Secondary malignant neoplasm of unspecified lung: Secondary | ICD-10-CM | POA: Diagnosis not present

## 2018-04-01 DIAGNOSIS — C3490 Malignant neoplasm of unspecified part of unspecified bronchus or lung: Secondary | ICD-10-CM | POA: Diagnosis not present

## 2018-04-01 DIAGNOSIS — R634 Abnormal weight loss: Secondary | ICD-10-CM | POA: Diagnosis not present

## 2018-04-01 DIAGNOSIS — R4182 Altered mental status, unspecified: Secondary | ICD-10-CM | POA: Diagnosis not present

## 2018-04-01 DIAGNOSIS — C787 Secondary malignant neoplasm of liver and intrahepatic bile duct: Secondary | ICD-10-CM | POA: Diagnosis not present

## 2018-04-01 DIAGNOSIS — R630 Anorexia: Secondary | ICD-10-CM | POA: Diagnosis not present

## 2018-04-02 DIAGNOSIS — C787 Secondary malignant neoplasm of liver and intrahepatic bile duct: Secondary | ICD-10-CM | POA: Diagnosis not present

## 2018-04-02 DIAGNOSIS — C7800 Secondary malignant neoplasm of unspecified lung: Secondary | ICD-10-CM | POA: Diagnosis not present

## 2018-04-02 DIAGNOSIS — R4182 Altered mental status, unspecified: Secondary | ICD-10-CM | POA: Diagnosis not present

## 2018-04-02 DIAGNOSIS — C3490 Malignant neoplasm of unspecified part of unspecified bronchus or lung: Secondary | ICD-10-CM | POA: Diagnosis not present

## 2018-04-02 DIAGNOSIS — R630 Anorexia: Secondary | ICD-10-CM | POA: Diagnosis not present

## 2018-04-02 DIAGNOSIS — R634 Abnormal weight loss: Secondary | ICD-10-CM | POA: Diagnosis not present

## 2018-04-03 ENCOUNTER — Inpatient Hospital Stay: Payer: 59

## 2018-04-03 DIAGNOSIS — C787 Secondary malignant neoplasm of liver and intrahepatic bile duct: Secondary | ICD-10-CM | POA: Diagnosis not present

## 2018-04-03 DIAGNOSIS — C3490 Malignant neoplasm of unspecified part of unspecified bronchus or lung: Secondary | ICD-10-CM | POA: Diagnosis not present

## 2018-04-03 DIAGNOSIS — R630 Anorexia: Secondary | ICD-10-CM | POA: Diagnosis not present

## 2018-04-03 DIAGNOSIS — C7800 Secondary malignant neoplasm of unspecified lung: Secondary | ICD-10-CM | POA: Diagnosis not present

## 2018-04-03 DIAGNOSIS — R4182 Altered mental status, unspecified: Secondary | ICD-10-CM | POA: Diagnosis not present

## 2018-04-03 DIAGNOSIS — R634 Abnormal weight loss: Secondary | ICD-10-CM | POA: Diagnosis not present

## 2018-04-04 DIAGNOSIS — R4182 Altered mental status, unspecified: Secondary | ICD-10-CM | POA: Diagnosis not present

## 2018-04-04 DIAGNOSIS — R634 Abnormal weight loss: Secondary | ICD-10-CM | POA: Diagnosis not present

## 2018-04-04 DIAGNOSIS — C3490 Malignant neoplasm of unspecified part of unspecified bronchus or lung: Secondary | ICD-10-CM | POA: Diagnosis not present

## 2018-04-04 DIAGNOSIS — C7800 Secondary malignant neoplasm of unspecified lung: Secondary | ICD-10-CM | POA: Diagnosis not present

## 2018-04-04 DIAGNOSIS — C787 Secondary malignant neoplasm of liver and intrahepatic bile duct: Secondary | ICD-10-CM | POA: Diagnosis not present

## 2018-04-04 DIAGNOSIS — R630 Anorexia: Secondary | ICD-10-CM | POA: Diagnosis not present

## 2018-04-05 DIAGNOSIS — C7800 Secondary malignant neoplasm of unspecified lung: Secondary | ICD-10-CM | POA: Diagnosis not present

## 2018-04-05 DIAGNOSIS — R634 Abnormal weight loss: Secondary | ICD-10-CM | POA: Diagnosis not present

## 2018-04-05 DIAGNOSIS — C787 Secondary malignant neoplasm of liver and intrahepatic bile duct: Secondary | ICD-10-CM | POA: Diagnosis not present

## 2018-04-05 DIAGNOSIS — R4182 Altered mental status, unspecified: Secondary | ICD-10-CM | POA: Diagnosis not present

## 2018-04-05 DIAGNOSIS — R630 Anorexia: Secondary | ICD-10-CM | POA: Diagnosis not present

## 2018-04-05 DIAGNOSIS — C3490 Malignant neoplasm of unspecified part of unspecified bronchus or lung: Secondary | ICD-10-CM | POA: Diagnosis not present

## 2018-04-06 ENCOUNTER — Ambulatory Visit: Payer: 59 | Attending: Radiation Oncology | Admitting: Radiation Oncology

## 2018-04-10 ENCOUNTER — Other Ambulatory Visit: Payer: 59

## 2018-05-06 DEATH — deceased

## 2018-05-26 ENCOUNTER — Ambulatory Visit: Payer: 59 | Admitting: Pulmonary Disease

## 2020-02-23 IMAGING — DX DG CHEST 2V
2 series · 2 of 2 positions shown · non-contrast
Comparison: Chest x-ray 01/07/2018

CLINICAL DATA: Cough and chest pain and shortness of breath for 1
month.

EXAM:
CHEST - 2 VIEW

[chest pa]
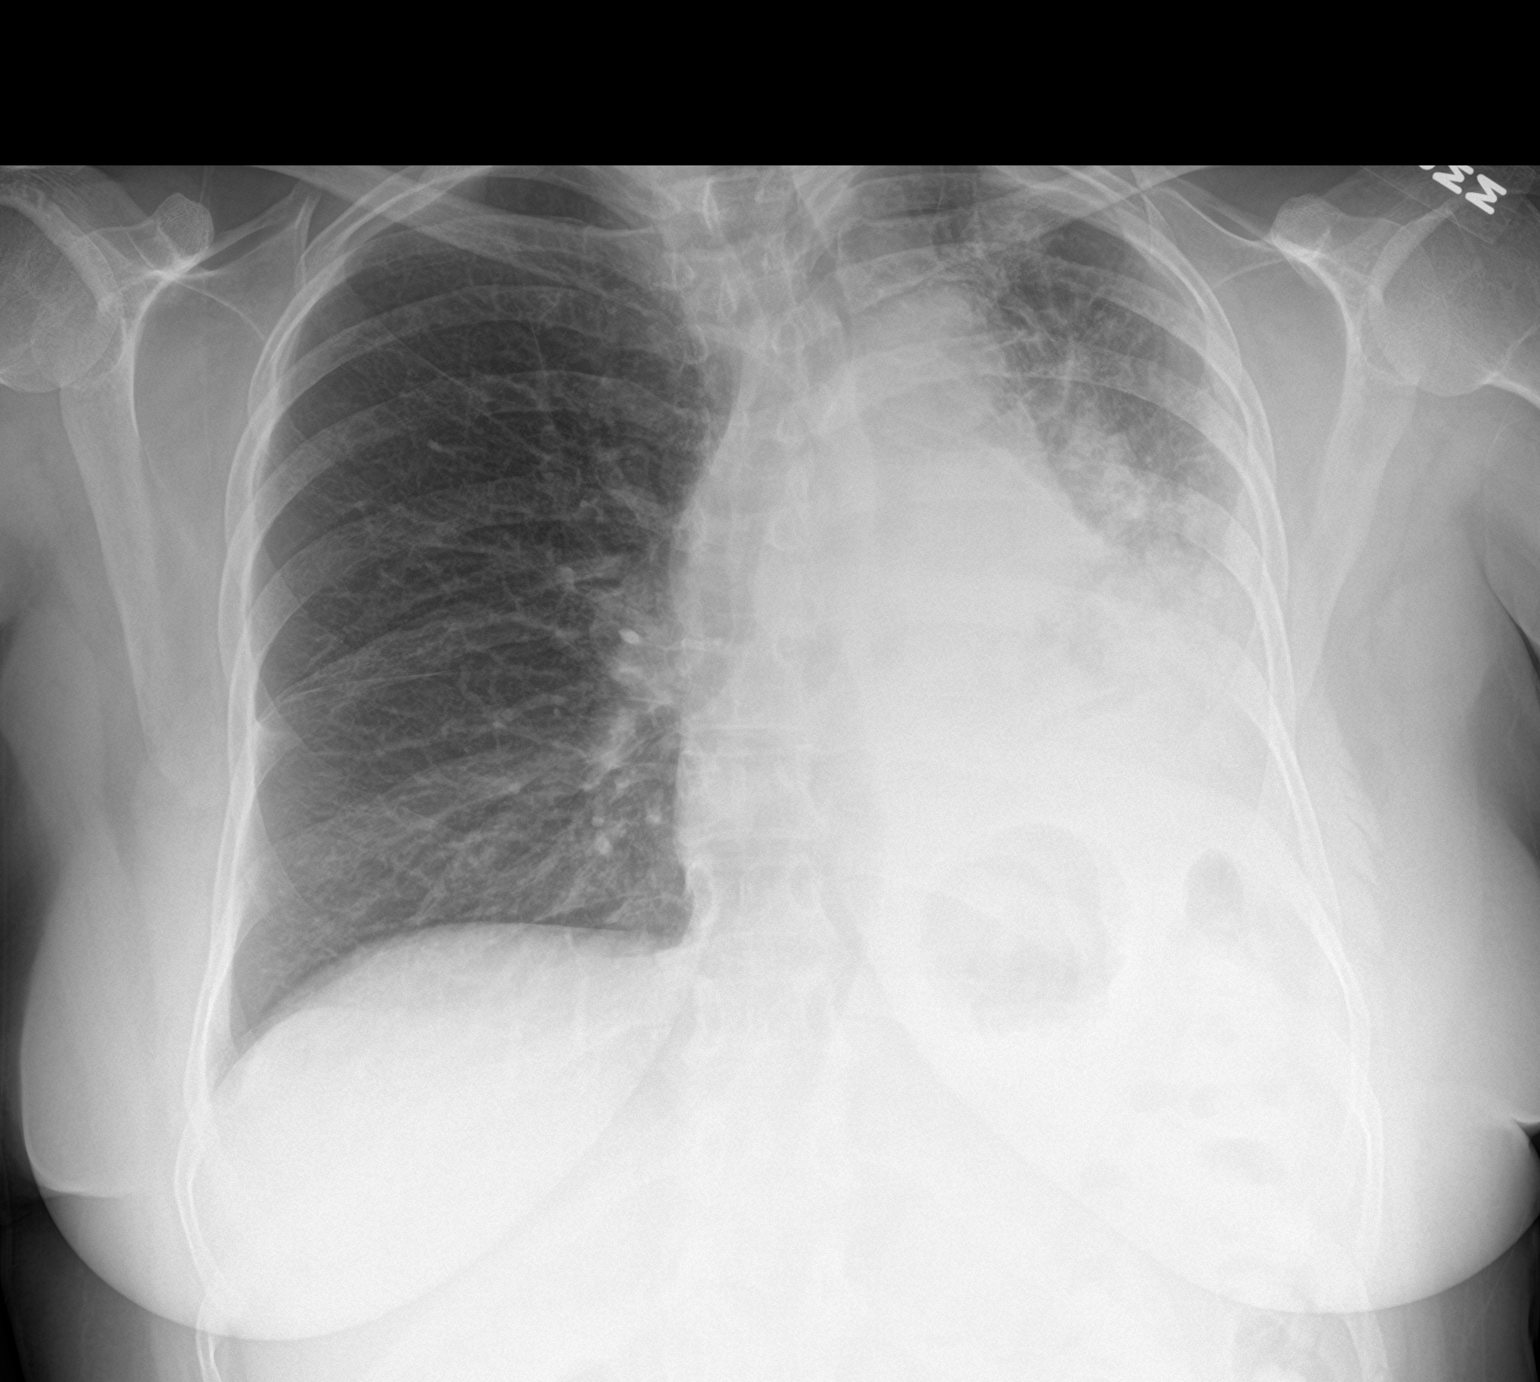

[chest lat]
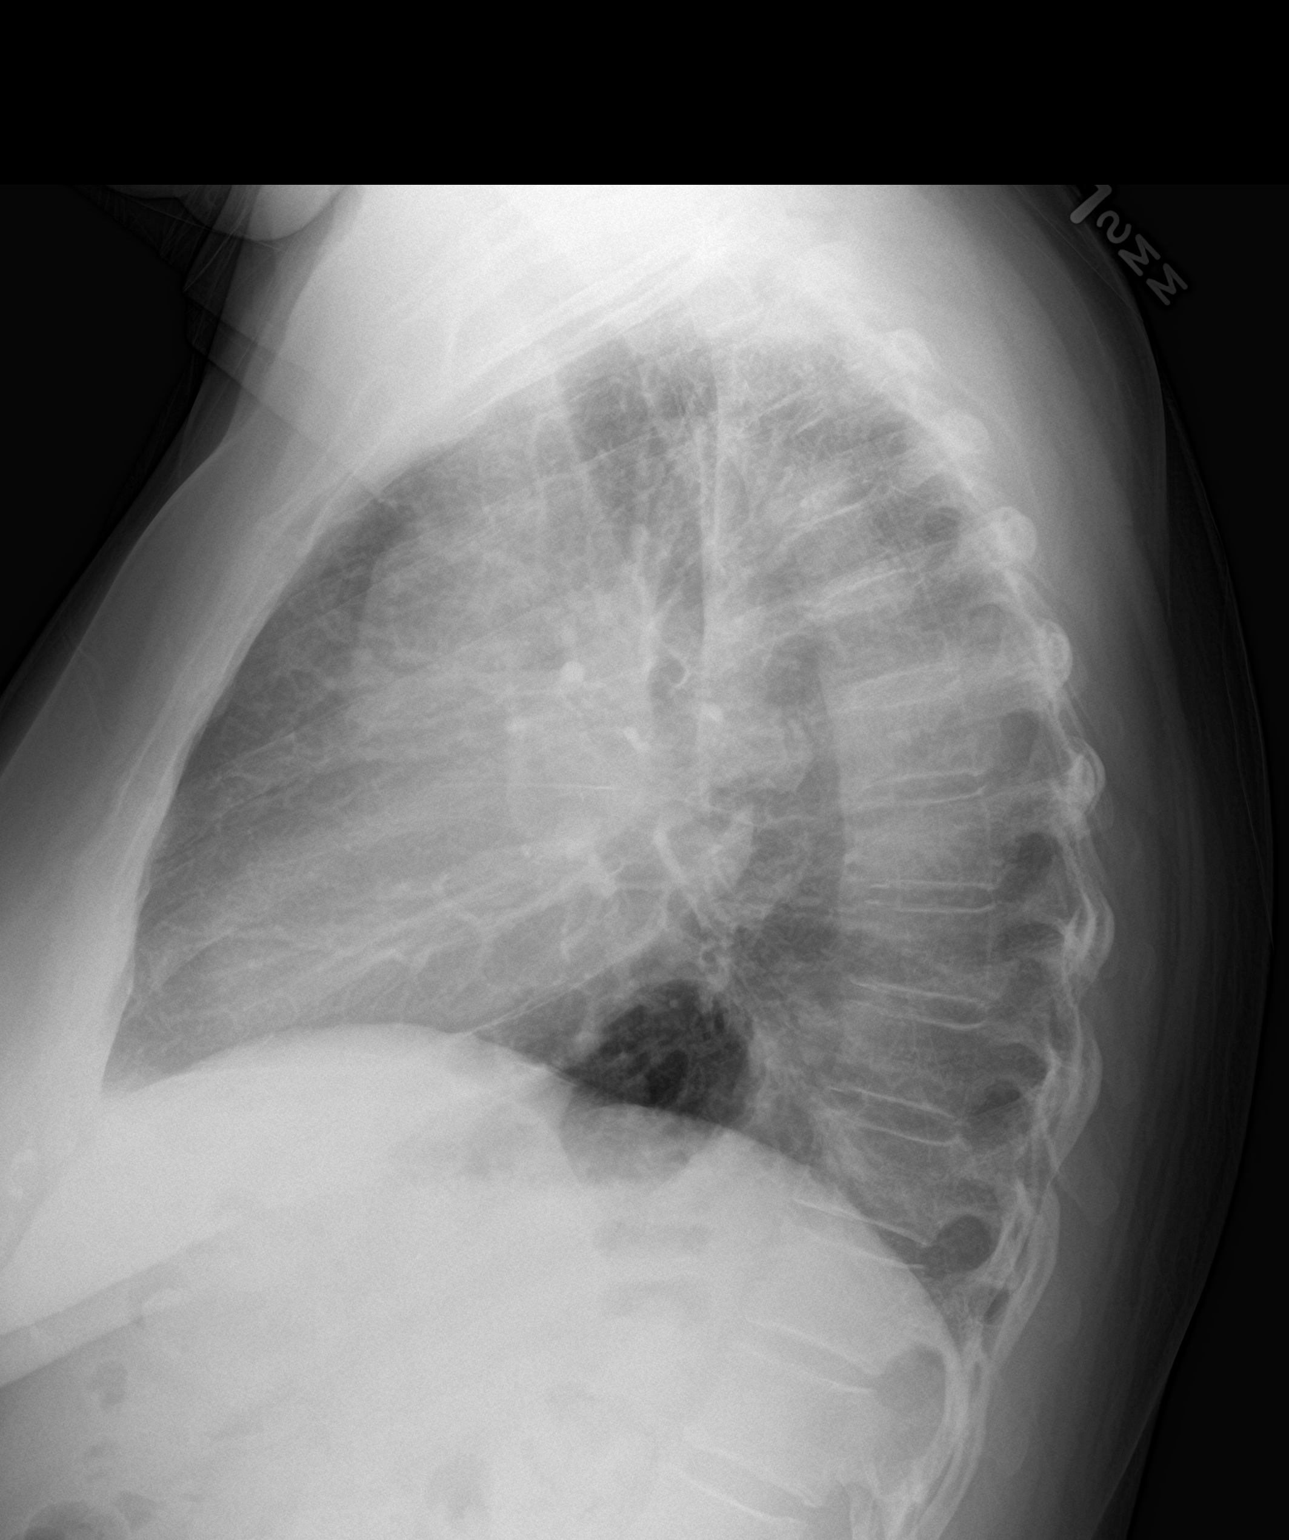

[2 of 2 positions shown; findings below may reference images not displayed]

FINDINGS: Significant worsening of left lung airspace process when compared
the prior study. There is a central appearing "mass ", significant
loss of volume in the left hemithorax and diffuse airspace process.
I would be very concerned about a central obstructing lesion with
obstructive pneumonitis. Recommend chest CT with contrast for
further evaluation.

The right lung appears clear.
IMPRESSION: Significant worsening of left lung airspace process with findings
worrisome for a central mass and obstructive pneumonitis. Recommend
CT with contrast for further evaluation.

## 2020-03-12 IMAGING — CT NM PET TUM IMG INITIAL (PI) SKULL BASE T - THIGH
1 of 7 series · 1 of 25 positions shown · non-contrast
Comparison: CT chest 01/17/2018

CLINICAL DATA: Initial treatment strategy for left hilar mass and
adenopathy.

EXAM:
NUCLEAR MEDICINE PET SKULL BASE TO THIGH
TECHNIQUE: 8.3 mCi F-18 FDG was injected intravenously. Full-ring PET imaging
was performed from the skull base to thigh after the radiotracer. CT
data was obtained and used for attenuation correction and anatomic
localization.
Fasting blood glucose: 88 mg/dl

[Series 3: pet sk_thigh ac · axial · 5.0mm · 4.07mm/px · 1 of 220 slices shown]
[im 110/220]
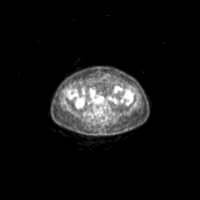

[1 of 25 positions shown; findings below may reference images not displayed]

FINDINGS: Mediastinal blood pool activity: SUV max

NECK: There is asymmetric uptake noted in the right arytenoid area.
This is likely due to a paralyzed left vocal cord which appears to
be bulging in the midline and likely due to recurrent laryngeal
nerve involvement as it courses under the aortic arch on the left
side.

No neck mass or lymphadenopathy.

Incidental CT findings: none

CHEST: Completely drowned/obstructed left lung due to tumor
obstructing the left mainstem bronchus.

There are 3 hypermetabolic lesions in the left lung.

The largest is at the left lung base and measures approximately 4
cm. The SUV max is 13.97. Slightly more superiorly in the left lower
lobe is a 3 cm nodule has an SUV max of 19.3.

The third lesion is in the left upper lobe and measures
approximately 19 mm. SUV max is 9.11.

2 cm left hilar node is hypermetabolic with SUV max of 11.8.

Large necrotic appearing subcarinal mass measures 3.6 cm and SUV max
is 31.45. This also involves the left mainstem bronchus which is
filled with tumor.

Two metastatic prevascular lymph nodes are also noted and there is
contralateral right paratracheal metastatic adenopathy.

No definite metastatic pulmonary nodules are noted in the right
lung.

Incidental CT findings: Small left pleural effusion.

ABDOMEN/PELVIS: Single metastatic lesion in the liver is noted
centrally near the caudate lobe. This is difficult to identify on
the non-contrast CT scan. It measures approximately 16 mm and the
SUV max is 8.12.

Bilateral adrenal gland metastasis. The right has an SUV max of
and the left has an SUV max of 6.13.

Small lymph node in the gastrohepatic ligament region has an SUV max
of 7.06.

Incidental CT findings: Large anterior abdominal wall hernia
containing fat and vessels. Soft tissue density in the hernia is
slightly hypermetabolic and may be due to fat necrosis or prior
incarceration. No overt tumor.

SKELETON: Osseous metastatic disease is demonstrated.

There are lesions involving the left clavicle, both upper scapulae,
numerous vertebral bodies, transverse processes, ribs, sternum and
pelvis. No spinal canal compromise is demonstrated.

Incidental CT findings: none
IMPRESSION: 1. Completely drowned/obstructed left lung due to extensive tumor in
the left mainstem bronchus with surrounding bulky mediastinal
lymphadenopathy.
2. Three left lung lesions along with left hilar, prevascular, AP
window, subcarinal and right paratracheal adenopathy.
3. Single hepatic metastatic focus.
4. Bilateral adrenal gland metastasis.
5. Extensive osseous metastatic disease.
